# Patient Record
Sex: Male | Born: 1970 | Race: White | Hispanic: No | Marital: Married | Smoking: Current every day smoker
Health system: Southern US, Community
[De-identification: ages and names within clinical notes are randomized; demographics above are authoritative.]

## PROBLEM LIST (undated history)

## (undated) DIAGNOSIS — E785 Hyperlipidemia, unspecified: Secondary | ICD-10-CM

## (undated) DIAGNOSIS — I639 Cerebral infarction, unspecified: Secondary | ICD-10-CM

## (undated) DIAGNOSIS — I10 Essential (primary) hypertension: Secondary | ICD-10-CM

## (undated) DIAGNOSIS — E119 Type 2 diabetes mellitus without complications: Secondary | ICD-10-CM

## (undated) HISTORY — DX: Cerebral infarction, unspecified: I63.9

## (undated) HISTORY — DX: Hyperlipidemia, unspecified: E78.5

## (undated) HISTORY — PX: HAND SURGERY: SHX662

---

## 2005-02-11 ENCOUNTER — Emergency Department (HOSPITAL_COMMUNITY): Admission: EM | Admit: 2005-02-11 | Discharge: 2005-02-11 | Payer: Self-pay | Admitting: Emergency Medicine

## 2005-02-13 ENCOUNTER — Ambulatory Visit: Payer: Self-pay | Admitting: Internal Medicine

## 2005-02-26 ENCOUNTER — Encounter: Admission: RE | Admit: 2005-02-26 | Discharge: 2005-02-26 | Payer: Self-pay | Admitting: Neurology

## 2005-03-10 ENCOUNTER — Ambulatory Visit: Payer: Self-pay

## 2005-09-11 ENCOUNTER — Ambulatory Visit: Payer: Self-pay | Admitting: Internal Medicine

## 2005-09-18 ENCOUNTER — Ambulatory Visit: Payer: Self-pay | Admitting: Internal Medicine

## 2007-03-31 DIAGNOSIS — R51 Headache: Secondary | ICD-10-CM

## 2007-03-31 DIAGNOSIS — R519 Headache, unspecified: Secondary | ICD-10-CM | POA: Insufficient documentation

## 2020-01-30 ENCOUNTER — Emergency Department (HOSPITAL_COMMUNITY): Payer: Managed Care, Other (non HMO)

## 2020-01-30 ENCOUNTER — Inpatient Hospital Stay (HOSPITAL_COMMUNITY): Payer: Managed Care, Other (non HMO)

## 2020-01-30 ENCOUNTER — Other Ambulatory Visit: Payer: Self-pay

## 2020-01-30 ENCOUNTER — Inpatient Hospital Stay (HOSPITAL_COMMUNITY)
Admission: EM | Admit: 2020-01-30 | Discharge: 2020-02-06 | DRG: 065 | Disposition: A | Discharge status: Home Health | Payer: Managed Care, Other (non HMO) | Attending: Internal Medicine | Admitting: Internal Medicine

## 2020-01-30 ENCOUNTER — Encounter (HOSPITAL_COMMUNITY): Payer: Self-pay | Admitting: Emergency Medicine

## 2020-01-30 DIAGNOSIS — Z8249 Family history of ischemic heart disease and other diseases of the circulatory system: Secondary | ICD-10-CM | POA: Diagnosis not present

## 2020-01-30 DIAGNOSIS — Z20822 Contact with and (suspected) exposure to covid-19: Secondary | ICD-10-CM | POA: Diagnosis present

## 2020-01-30 DIAGNOSIS — Z6839 Body mass index (BMI) 39.0-39.9, adult: Secondary | ICD-10-CM | POA: Diagnosis not present

## 2020-01-30 DIAGNOSIS — E1159 Type 2 diabetes mellitus with other circulatory complications: Secondary | ICD-10-CM | POA: Diagnosis not present

## 2020-01-30 DIAGNOSIS — I63331 Cerebral infarction due to thrombosis of right posterior cerebral artery: Secondary | ICD-10-CM | POA: Diagnosis not present

## 2020-01-30 DIAGNOSIS — E785 Hyperlipidemia, unspecified: Secondary | ICD-10-CM | POA: Diagnosis present

## 2020-01-30 DIAGNOSIS — E669 Obesity, unspecified: Secondary | ICD-10-CM | POA: Diagnosis present

## 2020-01-30 DIAGNOSIS — I639 Cerebral infarction, unspecified: Secondary | ICD-10-CM | POA: Diagnosis not present

## 2020-01-30 DIAGNOSIS — F17210 Nicotine dependence, cigarettes, uncomplicated: Secondary | ICD-10-CM | POA: Diagnosis present

## 2020-01-30 DIAGNOSIS — I6389 Other cerebral infarction: Secondary | ICD-10-CM | POA: Diagnosis not present

## 2020-01-30 DIAGNOSIS — Z809 Family history of malignant neoplasm, unspecified: Secondary | ICD-10-CM | POA: Diagnosis not present

## 2020-01-30 DIAGNOSIS — G8191 Hemiplegia, unspecified affecting right dominant side: Secondary | ICD-10-CM | POA: Diagnosis present

## 2020-01-30 DIAGNOSIS — R29701 NIHSS score 1: Secondary | ICD-10-CM | POA: Diagnosis present

## 2020-01-30 DIAGNOSIS — E119 Type 2 diabetes mellitus without complications: Secondary | ICD-10-CM

## 2020-01-30 DIAGNOSIS — I456 Pre-excitation syndrome: Secondary | ICD-10-CM | POA: Diagnosis not present

## 2020-01-30 DIAGNOSIS — E1165 Type 2 diabetes mellitus with hyperglycemia: Secondary | ICD-10-CM | POA: Diagnosis present

## 2020-01-30 DIAGNOSIS — I6381 Other cerebral infarction due to occlusion or stenosis of small artery: Secondary | ICD-10-CM | POA: Diagnosis not present

## 2020-01-30 DIAGNOSIS — Z720 Tobacco use: Secondary | ICD-10-CM | POA: Diagnosis not present

## 2020-01-30 DIAGNOSIS — R531 Weakness: Secondary | ICD-10-CM | POA: Diagnosis not present

## 2020-01-30 DIAGNOSIS — I10 Essential (primary) hypertension: Secondary | ICD-10-CM | POA: Diagnosis not present

## 2020-01-30 DIAGNOSIS — R9389 Abnormal findings on diagnostic imaging of other specified body structures: Secondary | ICD-10-CM

## 2020-01-30 HISTORY — DX: Type 2 diabetes mellitus without complications: E11.9

## 2020-01-30 HISTORY — DX: Essential (primary) hypertension: I10

## 2020-01-30 LAB — GLUCOSE, CAPILLARY
Glucose-Capillary: 186 mg/dL — ABNORMAL HIGH (ref 70–99)
Glucose-Capillary: 257 mg/dL — ABNORMAL HIGH (ref 70–99)

## 2020-01-30 LAB — HIV ANTIBODY (ROUTINE TESTING W REFLEX): HIV Screen 4th Generation wRfx: NONREACTIVE

## 2020-01-30 LAB — URINALYSIS, ROUTINE W REFLEX MICROSCOPIC
Bacteria, UA: NONE SEEN
Bilirubin Urine: NEGATIVE
Glucose, UA: >=500 mg/dL — AB
Ketones, ur: NEGATIVE mg/dL
Leukocytes,Ua: NEGATIVE
Nitrite: NEGATIVE
Protein, ur: 100 mg/dL — AB
Specific Gravity, Urine: >1.046 — ABNORMAL HIGH (ref 1.005–1.030)
pH: 6 (ref 5.0–8.0)

## 2020-01-30 LAB — RAPID URINE DRUG SCREEN, HOSP PERFORMED
Amphetamines: NOT DETECTED
Barbiturates: NOT DETECTED
Benzodiazepines: NOT DETECTED
Cocaine: NOT DETECTED
Opiates: NOT DETECTED
Tetrahydrocannabinol: NOT DETECTED

## 2020-01-30 LAB — ECHOCARDIOGRAM COMPLETE
Area-P 1/2: 2.91 cm2
Height: 68 in
S' Lateral: 3 cm
Weight: 4160 oz

## 2020-01-30 LAB — CBC
HCT: 49.5 % (ref 39.0–52.0)
Hemoglobin: 17.5 g/dL — ABNORMAL HIGH (ref 13.0–17.0)
MCH: 31.3 pg (ref 26.0–34.0)
MCHC: 35.4 g/dL (ref 30.0–36.0)
MCV: 88.4 fL (ref 80.0–100.0)
Platelets: 234 10*3/uL (ref 150–400)
RBC: 5.6 MIL/uL (ref 4.22–5.81)
RDW: 11.5 % (ref 11.5–15.5)
WBC: 13.4 10*3/uL — ABNORMAL HIGH (ref 4.0–10.5)
nRBC: 0 % (ref 0.0–0.2)

## 2020-01-30 LAB — COMPREHENSIVE METABOLIC PANEL
ALT: 40 U/L (ref 0–44)
AST: 26 U/L (ref 15–41)
Albumin: 3.9 g/dL (ref 3.5–5.0)
Alkaline Phosphatase: 95 U/L (ref 38–126)
Anion gap: 11 (ref 5–15)
BUN: 11 mg/dL (ref 6–20)
CO2: 24 mmol/L (ref 22–32)
Calcium: 9.4 mg/dL (ref 8.9–10.3)
Chloride: 98 mmol/L (ref 98–111)
Creatinine, Ser: 0.82 mg/dL (ref 0.61–1.24)
GFR calc Af Amer: >60 mL/min (ref >60)
GFR calc non Af Amer: >60 mL/min (ref >60)
Glucose, Bld: 300 mg/dL — ABNORMAL HIGH (ref 70–99)
Potassium: 4 mmol/L (ref 3.5–5.1)
Sodium: 133 mmol/L — ABNORMAL LOW (ref 135–145)
Total Bilirubin: 1.1 mg/dL (ref 0.3–1.2)
Total Protein: 7.4 g/dL (ref 6.5–8.1)

## 2020-01-30 LAB — DIFFERENTIAL
Abs Immature Granulocytes: 0.04 10*3/uL (ref 0.00–0.07)
Basophils Absolute: 0.1 10*3/uL (ref 0.0–0.1)
Basophils Relative: 1 %
Eosinophils Absolute: 0.1 10*3/uL (ref 0.0–0.5)
Eosinophils Relative: 1 %
Immature Granulocytes: 0 %
Lymphocytes Relative: 14 %
Lymphs Abs: 1.8 10*3/uL (ref 0.7–4.0)
Monocytes Absolute: 1 10*3/uL (ref 0.1–1.0)
Monocytes Relative: 7 %
Neutro Abs: 10.4 10*3/uL — ABNORMAL HIGH (ref 1.7–7.7)
Neutrophils Relative %: 77 %

## 2020-01-30 LAB — I-STAT CHEM 8, ED
BUN: 12 mg/dL (ref 6–20)
Calcium, Ion: 1.1 mmol/L — ABNORMAL LOW (ref 1.15–1.40)
Chloride: 99 mmol/L (ref 98–111)
Creatinine, Ser: 0.6 mg/dL — ABNORMAL LOW (ref 0.61–1.24)
Glucose, Bld: 301 mg/dL — ABNORMAL HIGH (ref 70–99)
HCT: 50 % (ref 39.0–52.0)
Hemoglobin: 17 g/dL (ref 13.0–17.0)
Potassium: 3.9 mmol/L (ref 3.5–5.1)
Sodium: 135 mmol/L (ref 135–145)
TCO2: 24 mmol/L (ref 22–32)

## 2020-01-30 LAB — LIPID PANEL
Cholesterol: 295 mg/dL — ABNORMAL HIGH (ref 0–200)
HDL: 65 mg/dL (ref >40)
LDL Cholesterol: 197 mg/dL — ABNORMAL HIGH (ref 0–99)
Total CHOL/HDL Ratio: 4.5 RATIO
Triglycerides: 164 mg/dL — ABNORMAL HIGH (ref <150)
VLDL: 33 mg/dL (ref 0–40)

## 2020-01-30 LAB — PROTIME-INR
INR: 1 (ref 0.8–1.2)
Prothrombin Time: 12.8 seconds (ref 11.4–15.2)

## 2020-01-30 LAB — CBG MONITORING, ED
Glucose-Capillary: 241 mg/dL — ABNORMAL HIGH (ref 70–99)
Glucose-Capillary: 228 mg/dL — ABNORMAL HIGH (ref 70–99)

## 2020-01-30 LAB — ETHANOL: Alcohol, Ethyl (B): <10 mg/dL (ref <10)

## 2020-01-30 LAB — HEMOGLOBIN A1C
Hgb A1c MFr Bld: 11 % — ABNORMAL HIGH (ref 4.8–5.6)
Mean Plasma Glucose: 269 mg/dL

## 2020-01-30 LAB — SARS CORONAVIRUS 2 (TAT 6-24 HRS): SARS Coronavirus 2: NEGATIVE

## 2020-01-30 LAB — APTT: aPTT: 35 seconds (ref 24–36)

## 2020-01-30 MED ORDER — ENOXAPARIN SODIUM 40 MG/0.4ML ~~LOC~~ SOLN
40.0000 mg | SUBCUTANEOUS | Status: DC
  Administered 2020-01-30 – 2020-02-06 (×8): 40 mg via SUBCUTANEOUS
  Filled 2020-01-30 (×8): qty 0.4

## 2020-01-30 MED ORDER — ACETAMINOPHEN 325 MG PO TABS
650.0000 mg | ORAL_TABLET | ORAL | Status: DC | PRN
  Administered 2020-01-30 – 2020-02-01 (×2): 650 mg via ORAL
  Filled 2020-01-30 (×2): qty 2

## 2020-01-30 MED ORDER — ATORVASTATIN CALCIUM 80 MG PO TABS
80.0000 mg | ORAL_TABLET | Freq: Every day | ORAL | Status: DC
  Administered 2020-01-30 – 2020-02-06 (×8): 80 mg via ORAL
  Filled 2020-01-30 (×8): qty 1

## 2020-01-30 MED ORDER — ASPIRIN EC 81 MG PO TBEC
81.0000 mg | DELAYED_RELEASE_TABLET | Freq: Every day | ORAL | Status: DC
  Administered 2020-01-30 – 2020-02-06 (×8): 81 mg via ORAL
  Filled 2020-01-30 (×8): qty 1

## 2020-01-30 MED ORDER — INSULIN ASPART 100 UNIT/ML ~~LOC~~ SOLN
0.0000 [IU] | Freq: Three times a day (TID) | SUBCUTANEOUS | Status: DC
  Administered 2020-01-30 (×2): 5 [IU] via SUBCUTANEOUS
  Administered 2020-01-30: 3 [IU] via SUBCUTANEOUS
  Administered 2020-01-31: 2 [IU] via SUBCUTANEOUS
  Administered 2020-01-31: 5 [IU] via SUBCUTANEOUS
  Administered 2020-01-31 – 2020-02-01 (×2): 2 [IU] via SUBCUTANEOUS
  Administered 2020-02-01 (×2): 3 [IU] via SUBCUTANEOUS
  Administered 2020-02-02: 2 [IU] via SUBCUTANEOUS
  Administered 2020-02-02: 3 [IU] via SUBCUTANEOUS
  Administered 2020-02-02 – 2020-02-03 (×3): 2 [IU] via SUBCUTANEOUS
  Administered 2020-02-04: 3 [IU] via SUBCUTANEOUS
  Administered 2020-02-04: 2 [IU] via SUBCUTANEOUS
  Administered 2020-02-05: 3 [IU] via SUBCUTANEOUS
  Administered 2020-02-05 – 2020-02-06 (×3): 2 [IU] via SUBCUTANEOUS
  Administered 2020-02-06: 3 [IU] via SUBCUTANEOUS

## 2020-01-30 MED ORDER — LABETALOL HCL 5 MG/ML IV SOLN
10.0000 mg | Freq: Once | INTRAVENOUS | Status: AC
  Administered 2020-01-30: 10 mg via INTRAVENOUS
  Filled 2020-01-30: qty 4

## 2020-01-30 MED ORDER — STROKE: EARLY STAGES OF RECOVERY BOOK
Freq: Once | Status: AC
  Filled 2020-01-30: qty 1

## 2020-01-30 MED ORDER — IOHEXOL 350 MG/ML SOLN
100.0000 mL | Freq: Once | INTRAVENOUS | Status: AC | PRN
  Administered 2020-01-30: 100 mL via INTRAVENOUS

## 2020-01-30 MED ORDER — CLOPIDOGREL BISULFATE 75 MG PO TABS
75.0000 mg | ORAL_TABLET | Freq: Every day | ORAL | Status: DC
  Administered 2020-01-30 – 2020-02-06 (×8): 75 mg via ORAL
  Filled 2020-01-30 (×8): qty 1

## 2020-01-30 MED ORDER — INSULIN ASPART 100 UNIT/ML ~~LOC~~ SOLN
0.0000 [IU] | Freq: Every day | SUBCUTANEOUS | Status: DC
  Administered 2020-01-30: 3 [IU] via SUBCUTANEOUS
  Administered 2020-01-31: 2 [IU] via SUBCUTANEOUS

## 2020-01-30 MED ORDER — ACETAMINOPHEN 650 MG RE SUPP: 650.0000 mg | RECTAL | Status: DC | PRN

## 2020-01-30 MED ORDER — ASPIRIN 300 MG RE SUPP: 300.0000 mg | Freq: Every day | RECTAL | Status: DC

## 2020-01-30 MED ORDER — ASPIRIN 325 MG PO TABS: 325.0000 mg | ORAL_TABLET | Freq: Every day | ORAL | Status: DC

## 2020-01-30 MED ORDER — INSULIN GLARGINE 100 UNIT/ML ~~LOC~~ SOLN
15.0000 [IU] | Freq: Every day | SUBCUTANEOUS | Status: DC
  Administered 2020-01-30 – 2020-02-01 (×3): 15 [IU] via SUBCUTANEOUS
  Filled 2020-01-30 (×4): qty 0.15

## 2020-01-30 MED ORDER — ACETAMINOPHEN 160 MG/5ML PO SOLN: 650.0000 mg | ORAL | Status: DC | PRN

## 2020-01-30 MED ORDER — HYDRALAZINE HCL 20 MG/ML IJ SOLN
10.0000 mg | Freq: Four times a day (QID) | INTRAMUSCULAR | Status: DC | PRN
  Administered 2020-01-30: 10 mg via INTRAVENOUS
  Filled 2020-01-30: qty 1

## 2020-01-30 NOTE — ED Notes (Signed)
Echo at bedside

## 2020-01-30 NOTE — Progress Notes (Signed)
Received from ER via stretcher; patient is alert and oriented X4; oriented to room and unit routine; fall safety reviewed; bed low and locked with bed alarm set.

## 2020-01-30 NOTE — ED Notes (Signed)
TC from Dr Pola Corn  . MD will place orders for BP range and Prn for BP.

## 2020-01-30 NOTE — Consult Note (Signed)
Requesting Physician: Dr. Bebe Shaggy    Chief Complaint: Right side numbness, difficulty getting words out  History obtained from: Patient and Chart    HPI:                                                                                                                                       Peter Tran is a 49 y.o. male past medical history significant for hypertension, diabetes mellitus not on any medications presents department as a code stroke for right-sided numbness difficulty getting words out.  Last known normal was around 3 PM.  Patient off from work and then when he called his brother noticed his speech was off.  Patient also felt numbness over the right side of his arm and leg.  EMS was called and his blood pressure was noted to be 260 x 160 mmHg. Patient was transported as a code stroke for mild language difficulty getting some words out when speaking in sentences as well as numbness over his right face arm and leg.  NIHSS was 1 on arrival.  Stat CT head showed early ischemic changes in the left homonymous which would correspond to patient's symptoms.  Date last known well: 8.8.21 Time last known well: 3 PM tPA Given: No, outside TPA window mild symptoms NIHSS: 1 Baseline MRS 0   Past Medical History:  Diagnosis Date  . Diabetes mellitus without complication (HCC)   . Hypertension     History reviewed. No pertinent surgical history.  No family history on file. Social History:  reports that he has been smoking. He has never used smokeless tobacco. He reports current alcohol use. He reports that he does not use drugs.  Allergies: Not on File  Medications:                                                                                                                        I reviewed home medications   ROS:  14 systems reviewed and negative  except above    Examination:                                                                                                      General: Appears well-developed  Psych: Affect appropriate to situation Eyes: No scleral injection HENT: No OP obstrucion Head: Normocephalic.  Cardiovascular: Normal rate and regular rhythm.  Respiratory: Effort normal and breath sounds normal to anterior ascultation GI: Soft.  No distension. There is no tenderness.  Skin: WDI    Neurological Examination Mental Status: Alert, oriented, thought content appropriate.  Speech fluent without evidence of aphasia. Able to follow 3 step commands without difficulty. Cranial Nerves: II: Visual fields grossly normal,  III,IV, VI: ptosis not present, extra-ocular motions intact bilaterally, pupils equal, round, reactive to light and accommodation V,VII: smile symmetric, reduced sensation on the right side of face VIII: hearing normal bilaterally IX,X: uvula rises symmetrically XI: bilateral shoulder shrug XII: midline tongue extension Motor: Right : Upper extremity   5/5    Left:     Upper extremity   5/5  Lower extremity   5/5     Lower extremity   5/5 Tone and bulk:normal tone throughout; no atrophy noted Sensory: Reduced sensation of the right arm right leg Plantars: Right: downgoing   Left: downgoing Cerebellar: normal finger-to-nose, normal rapid alternating movements and normal heel-to-shin test      Lab Results: Basic Metabolic Panel: Recent Labs  Lab 01/30/20 0048  NA 135  K 3.9  CL 99  GLUCOSE 301*  BUN 12  CREATININE 0.60*    CBC: Recent Labs  Lab 01/30/20 0039 01/30/20 0048  WBC 13.4*  --   NEUTROABS 10.4*  --   HGB 17.5* 17.0  HCT 49.5 50.0  MCV 88.4  --   PLT 234  --     Coagulation Studies: No results for input(s): LABPROT, INR in the last 72 hours.  Imaging: CT HEAD CODE STROKE WO CONTRAST  Result Date: 01/30/2020 CLINICAL DATA:  Code stroke. 49 year old male with  right side numbness and difficulty speaking last seen normal 1500 hours. EXAM: CT HEAD WITHOUT CONTRAST TECHNIQUE: Contiguous axial images were obtained from the base of the skull through the vertex without intravenous contrast. COMPARISON:  Brain MRI 02/26/2005.  Head CT 02/11/2005. FINDINGS: Brain: Cerebral volume appears normal. No midline shift, ventriculomegaly, mass effect, evidence of mass lesion, intracranial hemorrhage or evidence of cortically based acute infarction. Subtle new hypodensity in the left lateral thalamus near the posterior limb of the left internal capsule. Otherwise gray-white matter differentiation is within normal limits throughout the brain. Vascular: Calcified atherosclerosis at the skull base. No suspicious intracranial vascular hyperdensity. Skull: Chronic appearing periapical lucency at the posterior right maxilla, likely dental related. No acute osseous abnormality identified. Sinuses/Orbits: Paranasal sinuses, tympanic cavities and mastoids are clear. Other: Slight rightward gaze. Otherwise negative orbit and scalp soft tissues. ASPECTS [facility] Stroke Program Early CT Score) Total score (0-10 with 10 being normal): 10 IMPRESSION: 1. Acute lacunar infarct suspected at the lateral left thalamus. No  associated hemorrhage or mass effect. 2. Elsewhere stable and negative noncontrast CT appearance of the brain. ASPECTS 10. 3. These results were communicated to Dr. Laurence Slate at 12:23 am on 01/30/2020 by text page via the [facility] messaging system. Electronically Signed   By: Odessa Fleming M.D.   On: 01/30/2020 00:24     ASSESSMENT AND PLAN  49 year old male with hypertension, diabetes who is not taking any medications presents with headache right-sided numbness likely secondary to hypertensive emergency and left thalamic stroke.  Acute ischemic stroke-left lateral thalamus Hypertensive crisis  Risk factors: Hypertension, diabetes, obesity Etiology: Likely small vessel  disease  Recommend #Admission to telemetry floor # MRI of the brain without contrast # CTA head and neck #Transthoracic Echo  # Start patient on ASA 81 mg and Plavix #Start or continue Atorvastatin 80 mg/other high intensity statin # BP goal: permissive HTN upto 220/120 mmHg, as needed labetalol # HBAIC and Lipid profile # Telemetry monitoring # Frequent neuro checks # NPO until passes stroke swallow screen  Please page stroke NP  Or  PA  Or MD from 8am -4 pm  as this patient from this time will be  followed by the stroke.   You can look them up on [email or web address]  Password [facility]   Sadiel Mota [facility] Pager Number [phone]

## 2020-01-30 NOTE — ED Provider Notes (Signed)
[facility] EMERGENCY DEPARTMENT Provider Note   CSN: [id number] Arrival date & time: 01/30/20  0010  An emergency department physician performed an initial assessment on this suspected stroke patient at 0012.  History Chief Complaint - weakness and numbness  Level 5 caveat due to acuity of condition  Peter Tran is a 49 y.o. male.  The history is provided by the patient and the EMS personnel. The history is limited by the condition of the patient.  Neurologic Problem This is a new problem. The current episode started 6 to 12 hours ago. The problem occurs constantly. The problem has not changed since onset.Pertinent negatives include no chest pain, no abdominal pain, no headaches and no shortness of breath. Nothing aggravates the symptoms. Nothing relieves the symptoms. He has tried nothing for the symptoms.   Patient presents as a code stroke.  Last known well at 1500 on August 8. Patient noted right-sided numbness in his face, arm, leg. Patient is not taking daily medications. No other details on arrival    Past Medical History:  Diagnosis Date  . Diabetes mellitus without complication (HCC)   . Hypertension     Patient Active Problem List   Diagnosis Date Noted  . HEADACHE 03/31/2007    History reviewed. No pertinent surgical history.     No family history on file.  Social History   Tobacco Use  . Smoking status: Current Every Day Smoker  . Smokeless tobacco: Never Used  Substance Use Topics  . Alcohol use: Yes    Comment: couple of beers/daily  . Drug use: Never    Home Medications Prior to Admission medications   Not on File    Allergies    Patient has no allergy information on record.  Review of Systems   Review of Systems  Unable to perform ROS: Acuity of condition  Respiratory: Negative for shortness of breath.   Cardiovascular: Negative for chest pain.  Gastrointestinal: Negative for abdominal pain.  Neurological: Negative  for syncope and headaches.    Physical Exam Updated Vital Signs BP (!) 192/106   Pulse 90   Temp (!) 97 F (36.1 C) (Temporal)   Resp 20   Ht 1.727 m (5\' 8" )   Wt 117.9 kg   SpO2 95%   BMI 39.53 kg/m   Physical Exam CONSTITUTIONAL: Well developed/well nourished HEAD: Normocephalic/atraumatic EYES: EOMI/PERRL ENMT: Mucous membranes moist NECK: supple no meningeal signs SPINE/BACK:entire spine nontender CV: S1/S2 noted, no murmurs/rubs/gallops noted LUNGS: Lungs are clear to auscultation bilaterally, no apparent distress ABDOMEN: soft, nontender, no rebound or guarding, bowel sounds noted throughout abdomen GU:no cva tenderness NEURO: Pt is awake/alert/appropriate, no facial droop.  No arm drift.?  Weakness in right leg.  He reports sensory deficit in right face, right arm, right leg. EXTREMITIES: pulses normal/equalx4, full ROM SKIN: warm, color normal PSYCH: no abnormalities of mood noted, alert and oriented to situation  ED Results / Procedures / Treatments   Labs (all labs ordered are listed, but only abnormal results are displayed) Labs Reviewed  CBC - Abnormal; Notable for the following components:      Result Value   WBC 13.4 (*)    Hemoglobin 17.5 (*)    All other components within normal limits  DIFFERENTIAL - Abnormal; Notable for the following components:   Neutro Abs 10.4 (*)    All other components within normal limits  COMPREHENSIVE METABOLIC PANEL - Abnormal; Notable for the following components:   Sodium 133 (*)  Glucose, Bld 300 (*)    All other components within normal limits  I-STAT CHEM 8, ED - Abnormal; Notable for the following components:   Creatinine, Ser 0.60 (*)    Glucose, Bld 301 (*)    Calcium, Ion 1.10 (*)    All other components within normal limits  SARS CORONAVIRUS 2 (TAT 6-24 HRS)  ETHANOL  PROTIME-INR  APTT  RAPID URINE DRUG SCREEN, HOSP PERFORMED  URINALYSIS, ROUTINE W REFLEX MICROSCOPIC    EKG EKG  Interpretation  Date/Time:  Monday January 30 2020 00:48:50 EDT Ventricular Rate:  98 PR Interval:    QRS Duration: 100 QT Interval:  372 QTC Calculation: 475 R Axis:   14 Text Interpretation: Sinus rhythm Vent pre-excitat'n(WPW), left acces'y pathway No old tracing to compare Confirmed by Zadie Rhine ([id number]) on 01/30/2020 1:06:06 AM   Radiology CT HEAD CODE STROKE WO CONTRAST  Result Date: 01/30/2020 CLINICAL DATA:  Code stroke. 49 year old male with right side numbness and difficulty speaking last seen normal 1500 hours. EXAM: CT HEAD WITHOUT CONTRAST TECHNIQUE: Contiguous axial images were obtained from the base of the skull through the vertex without intravenous contrast. COMPARISON:  Brain MRI 02/26/2005.  Head CT 02/11/2005. FINDINGS: Brain: Cerebral volume appears normal. No midline shift, ventriculomegaly, mass effect, evidence of mass lesion, intracranial hemorrhage or evidence of cortically based acute infarction. Subtle new hypodensity in the left lateral thalamus near the posterior limb of the left internal capsule. Otherwise gray-white matter differentiation is within normal limits throughout the brain. Vascular: Calcified atherosclerosis at the skull base. No suspicious intracranial vascular hyperdensity. Skull: Chronic appearing periapical lucency at the posterior right maxilla, likely dental related. No acute osseous abnormality identified. Sinuses/Orbits: Paranasal sinuses, tympanic cavities and mastoids are clear. Other: Slight rightward gaze. Otherwise negative orbit and scalp soft tissues. ASPECTS [facility] Stroke Program Early CT Score) Total score (0-10 with 10 being normal): 10 IMPRESSION: 1. Acute lacunar infarct suspected at the lateral left thalamus. No associated hemorrhage or mass effect. 2. Elsewhere stable and negative noncontrast CT appearance of the brain. ASPECTS 10. 3. These results were communicated to Dr. Laurence Slate at 12:23 am on 01/30/2020 by text page via the [facility]  messaging system. Electronically Signed   By: Odessa Fleming M.D.   On: 01/30/2020 00:24    Procedures .Critical Care Performed by: Zadie Rhine, MD Authorized by: Zadie Rhine, MD   Critical care provider statement:    Critical care time (minutes):  45   Critical care start time:  01/30/2020 12:15 AM   Critical care end time:  01/30/2020 1:00 AM   Critical care time was exclusive of:  Separately billable procedures and treating other patients   Critical care was necessary to treat or prevent imminent or life-threatening deterioration of the following conditions:  CNS failure or compromise   Critical care was time spent personally by me on the following activities:  Development of treatment plan with patient or surrogate, discussions with consultants, examination of patient, evaluation of patient's response to treatment, re-evaluation of patient's condition, ordering and review of radiographic studies, ordering and review of laboratory studies, ordering and performing treatments and interventions, pulse oximetry and obtaining history from patient or surrogate   I assumed direction of critical care for this patient from another provider in my specialty: no      Medications Ordered in ED Medications  labetalol (NORMODYNE) injection 10 mg (10 mg Intravenous Given 01/30/20 0043)  iohexol (OMNIPAQUE) 350 MG/ML injection 100 mL (100 mLs Intravenous Contrast Given  01/30/20 0156)    ED Course  I have reviewed the triage vital signs and the nursing notes.  Pertinent labs & imaging results that were available during my care of the patient were reviewed by me and considered in my medical decision making (see chart for details).    MDM Rules/Calculators/A&P                          1:15 AM Patient seen on arrival as a code stroke.  He arrives via EMS.  Patient reports last known well was at 3 PM on August 8.  Patient reports he noted right face, arm, leg numbness.  Patient has significant  hypertension with blood pressures over 200.  Patient was awake and alert, denies any pain complaints.  Patient was seen in conjunction with neurology Dr. Laurence Slate.  CT head reveals lacunar infarct.  Patient is outside the window for any acute intervention after discussion with neurology. tPA in stroke considered but not given due to: Onset over 3-4.5hours  Due to severe hypertension, patient was given IV dose of labetalol.  His blood pressure is now improved to the 190s systolic.  On review of EKG, it is revealed the patient has what appears to be delta waves and potential Wolff-Parkinson-White syndrome. Patient denies any recent palpitations or syncope.  He has not seen a physician in over 4 years but does not recall this diagnosis previously.  This can be evaluated by cardiology on a nonemergent basis 2:06 AM Discussed the case with Dr. Julian Reil with [facility].  Patient will be admitted for stroke evaluation Final Clinical Impression(s) / ED Diagnoses Final diagnoses:  Lacunar infarct, acute (HCC)  Phillips Odor White pattern seen on electrocardiogram    Rx / DC Orders ED Discharge Orders    None       Zadie Rhine, MD 01/30/20 0206

## 2020-01-30 NOTE — ED Notes (Signed)
E message DR Pola Corn  About BP and Prn meds for BP

## 2020-01-30 NOTE — ED Notes (Signed)
Requested SEC to call Attending MD

## 2020-01-30 NOTE — Progress Notes (Addendum)
STROKE TEAM PROGRESS NOTE   INTERVAL HISTORY Patient is awake, alert, NAD, no family at bedside.  Patient does not have a primary care provider, and was not on any medication prior to admission. Discussed risk factors with patient and current need for medications as well as risk factor modifications. Patient verbalized an understanding.   Vitals:   01/30/20 0404 01/30/20 0550 01/30/20 0630 01/30/20 0730  BP: (!) 168/93 (!) 192/101 (!) 199/102 (!) 180/107  Pulse: 83 84 (!) 106 81  Resp: 20 18 20 15   Temp:      TempSrc:      SpO2: 96% 97% 100% 96%  Weight:      Height:       CBC:  Recent Labs  Lab 01/30/20 0039 01/30/20 0048  WBC 13.4*  --   NEUTROABS 10.4*  --   HGB 17.5* 17.0  HCT 49.5 50.0  MCV 88.4  --   PLT 234  --    Basic Metabolic Panel:  Recent Labs  Lab 01/30/20 0039 01/30/20 0048  NA 133* 135  K 4.0 3.9  CL 98 99  CO2 24  --   GLUCOSE 300* 301*  BUN 11 12  CREATININE 0.82 0.60*  CALCIUM 9.4  --    Lipid Panel:  Recent Labs  Lab 01/30/20 0559  CHOL 295*  TRIG 164*  HDL 65  CHOLHDL 4.5  VLDL 33  LDLCALC 03/31/20*   HgbA1c:  Recent Labs  Lab 01/30/20 0559  HGBA1C 11.0*    Alcohol Level  Recent Labs  Lab 01/30/20 0039  ETH <10    IMAGING past 24 hours CT ANGIO HEAD W OR WO CONTRAST  Result Date: 01/30/2020 CLINICAL DATA:  Neuro deficit, acute, stroke suspected EXAM: CT ANGIOGRAPHY HEAD AND NECK TECHNIQUE: Multidetector CT imaging of the head and neck was performed using the standard protocol during bolus administration of intravenous contrast. Multiplanar CT image reconstructions and MIPs were obtained to evaluate the vascular anatomy. Carotid stenosis measurements (when applicable) are obtained utilizing NASCET criteria, using the distal internal carotid diameter as the denominator. CONTRAST:  03/31/2020 OMNIPAQUE IOHEXOL 350 MG/ML SOLN COMPARISON:  None. FINDINGS: CTA NECK FINDINGS SKELETON: There is no bony spinal canal stenosis. No lytic or blastic  lesion. OTHER NECK: Normal pharynx, larynx and major salivary glands. No cervical lymphadenopathy. Unremarkable thyroid gland. UPPER CHEST: No pneumothorax or pleural effusion. No nodules or masses. AORTIC ARCH: There is no calcific atherosclerosis of the aortic arch. There is no aneurysm, dissection or hemodynamically significant stenosis of the visualized portion of the aorta. Conventional 3 vessel aortic branching pattern. The visualized proximal subclavian arteries are widely patent. RIGHT CAROTID SYSTEM: Normal without aneurysm, dissection or stenosis. LEFT CAROTID SYSTEM: Normal without aneurysm, dissection or stenosis. VERTEBRAL ARTERIES: Right dominant configuration. Both origins are clearly patent. There is no dissection, occlusion or flow-limiting stenosis to the skull base (V1-V3 segments). CTA HEAD FINDINGS POSTERIOR CIRCULATION: --Vertebral arteries: Atherosclerotic calcification narrows the left V4 segment. The right is normal. --Inferior cerebellar arteries: Normal. --Basilar artery: Normal. --Superior cerebellar arteries: Normal. --Posterior cerebral arteries (PCA): Normal. ANTERIOR CIRCULATION: --Intracranial internal carotid arteries: There is moderate stenosis of the lacerum segment of the left internal carotid artery secondary to noncalcified plaque. Otherwise, there is bilateral atherosclerotic calcification with mild multifocal stenosis. --Anterior cerebral arteries (ACA): Normal. Both A1 segments are present. Patent anterior communicating artery (a-comm). --Middle cerebral arteries (MCA): Normal. VENOUS SINUSES: As permitted by contrast timing, patent. ANATOMIC VARIANTS: None Review of the MIP images confirms  the above findings. IMPRESSION: 1. No emergent large vessel occlusion. 2. Moderate stenosis of the lacerum segment of the left internal carotid artery secondary to noncalcified plaque. 3. Mild stenosis of the left vertebral artery V4 segment. 4. Aortic Atherosclerosis (ICD10-I70.0).  Electronically Signed   By: Deatra RobinsonKevin  Herman M.D.   On: 01/30/2020 02:13   DG Chest 2 View  Result Date: 01/30/2020 CLINICAL DATA:  Right-sided numbness EXAM: CHEST - 2 VIEW COMPARISON:  None. FINDINGS: The heart size and mediastinal contours are within normal limits. Both lungs are clear. The visualized skeletal structures are unremarkable. IMPRESSION: No active cardiopulmonary disease. Electronically Signed   By: Deatra RobinsonKevin  Herman M.D.   On: 01/30/2020 02:36   CT ANGIO NECK W OR WO CONTRAST  Result Date: 01/30/2020 CLINICAL DATA:  Neuro deficit, acute, stroke suspected EXAM: CT ANGIOGRAPHY HEAD AND NECK TECHNIQUE: Multidetector CT imaging of the head and neck was performed using the standard protocol during bolus administration of intravenous contrast. Multiplanar CT image reconstructions and MIPs were obtained to evaluate the vascular anatomy. Carotid stenosis measurements (when applicable) are obtained utilizing NASCET criteria, using the distal internal carotid diameter as the denominator. CONTRAST:  100mL OMNIPAQUE IOHEXOL 350 MG/ML SOLN COMPARISON:  None. FINDINGS: CTA NECK FINDINGS SKELETON: There is no bony spinal canal stenosis. No lytic or blastic lesion. OTHER NECK: Normal pharynx, larynx and major salivary glands. No cervical lymphadenopathy. Unremarkable thyroid gland. UPPER CHEST: No pneumothorax or pleural effusion. No nodules or masses. AORTIC ARCH: There is no calcific atherosclerosis of the aortic arch. There is no aneurysm, dissection or hemodynamically significant stenosis of the visualized portion of the aorta. Conventional 3 vessel aortic branching pattern. The visualized proximal subclavian arteries are widely patent. RIGHT CAROTID SYSTEM: Normal without aneurysm, dissection or stenosis. LEFT CAROTID SYSTEM: Normal without aneurysm, dissection or stenosis. VERTEBRAL ARTERIES: Right dominant configuration. Both origins are clearly patent. There is no dissection, occlusion or flow-limiting  stenosis to the skull base (V1-V3 segments). CTA HEAD FINDINGS POSTERIOR CIRCULATION: --Vertebral arteries: Atherosclerotic calcification narrows the left V4 segment. The right is normal. --Inferior cerebellar arteries: Normal. --Basilar artery: Normal. --Superior cerebellar arteries: Normal. --Posterior cerebral arteries (PCA): Normal. ANTERIOR CIRCULATION: --Intracranial internal carotid arteries: There is moderate stenosis of the lacerum segment of the left internal carotid artery secondary to noncalcified plaque. Otherwise, there is bilateral atherosclerotic calcification with mild multifocal stenosis. --Anterior cerebral arteries (ACA): Normal. Both A1 segments are present. Patent anterior communicating artery (a-comm). --Middle cerebral arteries (MCA): Normal. VENOUS SINUSES: As permitted by contrast timing, patent. ANATOMIC VARIANTS: None Review of the MIP images confirms the above findings. IMPRESSION: 1. No emergent large vessel occlusion. 2. Moderate stenosis of the lacerum segment of the left internal carotid artery secondary to noncalcified plaque. 3. Mild stenosis of the left vertebral artery V4 segment. 4. Aortic Atherosclerosis (ICD10-I70.0). Electronically Signed   By: Deatra RobinsonKevin  Herman M.D.   On: 01/30/2020 02:13   MR BRAIN WO CONTRAST  Result Date: 01/30/2020 CLINICAL DATA:  Stroke follow-up EXAM: MRI HEAD WITHOUT CONTRAST TECHNIQUE: Multiplanar, multiecho pulse sequences of the brain and surrounding structures were obtained without intravenous contrast. COMPARISON:  Brain MRI 02/26/2005 Head CT 01/30/2020 FINDINGS: Brain: Small acute infarct of the anterolateral left thalamus. Normal white matter signal. Normal volume of CSF spaces. No chronic microhemorrhage. Normal midline structures. Vascular: Normal flow voids. Skull and upper cervical spine: Normal marrow signal. Sinuses/Orbits: Negative. Other: None. IMPRESSION: Small acute infarct of the anterolateral left thalamus. No acute hemorrhage or  mass effect. Electronically Signed  By: Deatra Robinson M.D.   On: 01/30/2020 01:51   CT HEAD CODE STROKE WO CONTRAST  Result Date: 01/30/2020 CLINICAL DATA:  Code stroke. 49 year old male with right side numbness and difficulty speaking last seen normal 1500 hours. EXAM: CT HEAD WITHOUT CONTRAST TECHNIQUE: Contiguous axial images were obtained from the base of the skull through the vertex without intravenous contrast. COMPARISON:  Brain MRI 02/26/2005.  Head CT 02/11/2005. FINDINGS: Brain: Cerebral volume appears normal. No midline shift, ventriculomegaly, mass effect, evidence of mass lesion, intracranial hemorrhage or evidence of cortically based acute infarction. Subtle new hypodensity in the left lateral thalamus near the posterior limb of the left internal capsule. Otherwise gray-white matter differentiation is within normal limits throughout the brain. Vascular: Calcified atherosclerosis at the skull base. No suspicious intracranial vascular hyperdensity. Skull: Chronic appearing periapical lucency at the posterior right maxilla, likely dental related. No acute osseous abnormality identified. Sinuses/Orbits: Paranasal sinuses, tympanic cavities and mastoids are clear. Other: Slight rightward gaze. Otherwise negative orbit and scalp soft tissues. ASPECTS [facility] Stroke Program Early CT Score) Total score (0-10 with 10 being normal): 10 IMPRESSION: 1. Acute lacunar infarct suspected at the lateral left thalamus. No associated hemorrhage or mass effect. 2. Elsewhere stable and negative noncontrast CT appearance of the brain. ASPECTS 10. 3. These results were communicated to Dr. Laurence Slate at 12:23 am on 01/30/2020 by text page via the [facility] messaging system. Electronically Signed   By: Odessa Fleming M.D.   On: 01/30/2020 00:24    PHYSICAL EXAM  ZY:SAYTK, alert, able to follow commands, oriented to name/age/month/year CN: VFF,PERRLA, EOEMI, tongue protrudes midline. Face symmetric, Decreases facial sensation/tingling  right side DTRs: 2+ biceps, patella Cerebellar: no ataxia noted. Motor: 5/5 strength in all 4 extremties Sensory: decreased sensation arm and leg right side.  ASSESSMENT/PLAN Peter Tran is a 49 y.o. male past medical history significant for hypertension, diabetes mellitus not on any medications presents department as a code stroke for right-sided numbness difficulty getting words out..   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  Code Stroke CTH 1. Acute lacunar infarct suspected at the lateral left thalamus. No associated hemorrhage or mass effect. 2. Elsewhere stable and negative noncontrast CT appearance of thebrain. ASPECTS 10  CTA head & neck 1. No emergent large vessel occlusion. 2. Moderate stenosis of the lacerum segment of the left internal carotid artery secondary to noncalcified plaque. 3. Mild stenosis of the left vertebral artery V4 segment. MRI  Small acute infarct of the anterolateral left thalamus. No acute hemorrhage or mass effect.  2D Echo 1. Left ventricular ejection fraction, by estimation, is 55 to 60%. The  left ventricle has normal function. The left ventricle has no regional  wall motion abnormalities. Left ventricular diastolic function could not  be evaluated.  2. Right ventricular systolic function is normal. The right ventricular  size is normal. Tricuspid regurgitation signal is inadequate for assessing  PA pressure.  3. The mitral valve is degenerative. No evidence of mitral valve  regurgitation. No evidence of mitral stenosis.  4. The aortic valve is tricuspid. Aortic valve regurgitation is not  visualized. No aortic stenosis is present.  5. The inferior vena cava is normal in size with greater than 50%  respiratory variability, suggesting right atrial pressure of 3 mmHg.   Conclusion(s)/Recommendation(s): No intracardiac source of embolism  detected on this transthoracic study. A transesophageal echocardiogram  is  recommended to exclude cardiac source of embolism if clinically indicated.  LDL 197  HgbA1c 11.0  VTE prophylaxis - lovenox    Diet   Diet Carb Modified Fluid consistency: Thin; Room service appropriate? Yes     No antithrombotic prior to admission, now on aspirin 81 mg daily.   Therapy recommendations:  pending  Disposition:  Pending   Hypertension  Home meds:  None   Unstable . Permissive hypertension (OK if < 220/120) but gradually normalize in 5-7 days . Long-term BP goal normotensive  Hyperlipidemia  Home meds:  none,  LDL 197, goal < 70  Add atorvastatin 80 mg daily  Continue statin at discharge  Diabetes type II Uncontrolled  Home meds:  none  HgbA1c 11.0, goal < 7.0  CBGs Recent Labs    01/30/20 0810  GLUCAP 241*      SSI  Other Stroke Risk Factors  Cigarette smoker advised to stop smoking  ETOH use, alcohol level <10, advised to drink no more than 2 drink(s) a day  Obesity, Body mass index is 39.53 kg/m., recommend weight loss, diet and exercise as appropriate   Other recommendations  Smoking cessation   needs Primary care provider Hospital day # 0  Valentina Lucks, MSN, NP-C [facility] [phone]  ATTENDING NOTE: I reviewed above note and agree with the assessment and plan. Pt was seen and examined.   49 year old male with history of hypertension, diabetes, smoker admitted for right-sided numbness, ataxia, speech difficulty.  BP 260/160 on presentation.  MRI showed left thalamic infarct.  CT head and neck multifocal atherosclerosis including left VA origin, bilateral ICA siphon, left ICA bulb and mild stenosis of basilar artery.  EF 55 to 60%.  A1c 11.0, LDL 197.  UDS negative.  BP 160s to 190s during admission.  On exam, patient awake alert, orientated x3.  Visual field full, no gaze deviation, PERRL, facial symmetrical, tongue midline.  Bilateral upper and lower extremity 5/5 strength, however right face  arm and leg decreased light touch sensation with right finger-to-nose and heel-to-shin ataxia.  Patient stroke etiology likely due to small vessel disease, risk factors including uncontrolled diabetes, hyperlipidemia, and hypertension as well as smoking.  Recommend aspirin 81 and Plavix 75 DAPT for 3 weeks and then aspirin alone.  Continue Lipitor 80.  Aggressive risk factor modification.  Quit smoking.  Close follow-up with PCP.  PT/OT recommend home health PT/OT.  Neurology will sign off. Please call with questions. Pt will follow up with stroke clinic NP at [facility] in about 4 weeks. Thanks for the consult.   Marvel Plan, MD PhD Stroke Neurology 01/30/2020 8:57 PM     To contact Stroke Continuity provider, please refer to [email or web address]. After hours, contact General Neurology

## 2020-01-30 NOTE — ED Notes (Signed)
TC to [place] on [address] to report Pt had PRN for BP and SBP is now 171/100

## 2020-01-30 NOTE — ED Notes (Signed)
Pt to MRI at this time.

## 2020-01-30 NOTE — Evaluation (Signed)
Physical Therapy Evaluation Patient Details Name: Peter Tran MRN: [id number] DOB: Jun 22, 1971 Today's Date: 01/30/2020   History of Present Illness  Pt is a 49yo male admitted with L thalamus CVA. Pt hasn't been to a MD in a while. Noted high BP 260/160. PMH: DM, HTN    Clinical Impression  Pt admitted with above. Pt was indep and working PTA. Pt now with impaired sensation, co-ordination and strength on R UE and LE limiting ability to transfer and ambulate safely indep. Was limited to mobility in room today with further assess ambulation next session. Pt will need be functioning safely at a mod I level for safe d/c home as pt lives alone.    Follow Up Recommendations Home health PT;Supervision/Assistance - 24 hour (will re-asses next session)    Equipment Recommendations  Rolling walker with 5" wheels (may progress and not need it)    Recommendations for Other Services       Precautions / Restrictions Precautions Precautions: Fall Precaution Comments: R sided numbness Restrictions Weight Bearing Restrictions: No      Mobility  Bed Mobility Overal bed mobility: Needs Assistance Bed Mobility: Supine to Sit;Sit to Supine     Supine to sit: Min guard Sit to supine: Min guard   General bed mobility comments: min guard for safety due to impulsivity and narrow gurney in ED  Transfers Overall transfer level: Needs assistance Equipment used: None Transfers: Sit to/from Stand Sit to Stand: Min assist         General transfer comment: minA for safety, pt reaching for something to hold onto, pt feels unsteady  Ambulation/Gait Ambulation/Gait assistance: Min assist;Mod assist Gait Distance (Feet): 5 Feet Assistive device: 1 person hand held assist Gait Pattern/deviations: Decreased stride length;Wide base of support Gait velocity: slow   General Gait Details: pt reports "I feel like I'm drunk". limited to ED room per RN due to business in hallway. pt unsteady and would  benefit from RW at this time  Stairs            Wheelchair Mobility    Modified Rankin (Stroke Patients Only) Modified Rankin (Stroke Patients Only) Pre-Morbid Rankin Score: No symptoms Modified Rankin: Moderate disability     Balance Overall balance assessment: Needs assistance Sitting-balance support: Feet supported;No upper extremity supported Sitting balance-Leahy Scale: Fair     Standing balance support: Single extremity supported Standing balance-Leahy Scale: Poor Standing balance comment: pt dependent on device or physical assist                             Pertinent Vitals/Pain Pain Assessment: 0-10 Pain Score: 0-No pain Pain Location: headache- recevied 2 tyelnol and now no headache    Home Living Family/patient expects to be discharged to:: Private residence Living Arrangements: Alone Available Help at Discharge: Family;Available PRN/intermittently (pt reports he has some neighbors that could help) Type of Home: House Home Access: Level entry     Home Layout: One level Home Equipment: None      Prior Function Level of Independence: Independent         Comments: works as a Production designer, theatre/television/film at [employer]   Dominant Hand: Right    Extremity/Trunk Assessment   Upper Extremity Assessment Upper Extremity Assessment: RUE deficits/detail RUE Deficits / Details: grossly 4/5 RUE Sensation: decreased light touch (50% deficits) RUE Coordination: decreased fine motor;decreased gross motor    Lower Extremity Assessment Lower Extremity  Assessment: RLE deficits/detail RLE Deficits / Details: groslsy 4/5 RLE Sensation: decreased light touch (deficit of 50%) RLE Coordination: decreased gross motor    Cervical / Trunk Assessment Cervical / Trunk Assessment: Normal  Communication   Communication: Expressive difficulties  Cognition Arousal/Alertness: Awake/alert Behavior During Therapy: Impulsive Overall Cognitive Status:  No family/caregiver present to determine baseline cognitive functioning Area of Impairment: Safety/judgement;Problem solving                         Safety/Judgement: Decreased awareness of safety   Problem Solving: Requires verbal cues;Requires tactile cues General Comments: pt aware of deficits however remains impulsive      General Comments General comments (skin integrity, edema, etc.): BP 189/102, SOB with movement, HR 120s, RR in 30s, pt denies SOB when asked    Exercises     Assessment/Plan    PT Assessment Patient needs continued PT services  PT Problem List Decreased strength;Decreased activity tolerance;Decreased balance;Decreased mobility;Decreased coordination;Decreased cognition;Decreased knowledge of use of DME;Decreased safety awareness;Impaired sensation       PT Treatment Interventions DME instruction;Gait training;Stair training;Functional mobility training;Therapeutic activities;Therapeutic exercise;Balance training;Neuromuscular re-education;Cognitive remediation    PT Goals (Current goals can be found in the Care Plan section)  Acute Rehab PT Goals Patient Stated Goal: get better, not to rush it PT Goal Formulation: With patient Time For Goal Achievement: 02/13/20 Potential to Achieve Goals: Good Additional Goals Additional Goal #1: Pt to scrore >19 on DGI to indicate minimal falls risk.    Frequency Min 4X/week   Barriers to discharge Decreased caregiver support lives alone    Co-evaluation               AM-PAC PT "6 Clicks" Mobility  Outcome Measure Help needed turning from your back to your side while in a flat bed without using bedrails?: A Little Help needed moving from lying on your back to sitting on the side of a flat bed without using bedrails?: A Little Help needed moving to and from a bed to a chair (including a wheelchair)?: A Little Help needed standing up from a chair using your arms (e.g., wheelchair or bedside  chair)?: A Little Help needed to walk in hospital room?: A Lot Help needed climbing 3-5 steps with a railing? : A Lot 6 Click Score: 16    End of Session Equipment Utilized During Treatment: Gait belt Activity Tolerance: Patient limited by fatigue (and being in ED) Patient left: in bed;with call bell/phone within reach Nurse Communication: Mobility status PT Visit Diagnosis: Unsteadiness on feet (R26.81);Difficulty in walking, not elsewhere classified (R26.2)    Time: 1315-1330 PT Time Calculation (min) (ACUTE ONLY): 15 min   Charges:   PT Evaluation $PT Eval Moderate Complexity: 1 Mod          Lewis Shock, PT, DPT Acute Rehabilitation Services Pager #: [phone] Office #: [phone]   Iona Hansen 01/30/2020, 2:35 PM

## 2020-01-30 NOTE — Progress Notes (Signed)
PROGRESS NOTE  Peter Tran  DOB: 06-29-1970  PCP: Patient, No Pcp Per INO:[id number]  DOA: 01/30/2020  LOS: 0 days   No chief complaint on file.  Brief narrative: Peter Tran is a 49 y.o. male with PMH of colitis and hypertension who had not seen a physician for last 4 years and was not taking any medicine at home. Patient presented to the ED on 01/30/2020 with complaint of headache, right-sided numbness and word finding difficulties. EMS noted blood pressure to be 260/160. Brought to ED as a stroke alert. Stat CT head showed acute lacunar infarct at the lateral left thalamus. MRI confirmed CT finding. CT angio of head and neck did not show any emergent large vessel occlusion.  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 Seen by neurology. Admitted to hospital service for stroke work-up.  Subjective: Patient was seen and examined this morning. Waiting in ED for inpatient bed Anxious.  Continues to have word finding difficulty.  Continues to report right-sided numbness. Blood pressure systolic within 180 and 200 mostly. LDL 197, HDL 65, A1c 11  Assessment/Plan: Acute ischemic stroke-left lateral thalamus -Presented with right-sided numbness, headache and word finding difficulty. -CT and MRI finding as above showing acute infarct of lateral left thrombus -Stroke work-up started. -LDL 197, HDL 65, A1c 11 -Blood pressures running elevated.  Permissive hypertension allowed for first 48 to 72 hours. -Pending echocardiogram. -Neurology consultation appreciated. -PT/OT/ST eval awaited. -Started on dual antiplatelet therapy with aspirin 81 mg daily and Plavix 75 mg daily and Lipitor 80 mg daily.  Uncontrolled diabetes mellitus type 2 -A1c 11 -Not on any meds at home. -Sliding-scale insulin with Accu-Cheks. -I will start patient on Lantus 15 units this morning.  Hypertensive urgency -Not on blood pressure at home.  Presented with a blood pressure of  209/129 -Permissive hypertension allowed for now.  Intend to start on blood pressure medicines prior to discharge.  Dyslipidemia -LDL 197, HDL 65 -Started on Lipitor 80 mg daily.  ?  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 -No h/o syncope nor random palpitations per patient. -Admitting MD has consulted cardiology.  Mobility: PT eval pending Code Status:   Code Status: Full Code  Nutritional status: Body mass index is 39.53 kg/m.     Diet Order            Diet Carb Modified Fluid consistency: Thin; Room service appropriate? Yes  Diet effective now                 DVT prophylaxis: enoxaparin (LOVENOX) injection 40 mg Start: 01/30/20 1000   Antimicrobials:  None Fluid: None  Consultants: Neurology, cardiology Family Communication:  None at bedside  Status is: Inpatient  Remains inpatient appropriate because:Ongoing diagnostic testing needed not appropriate for outpatient work up   Dispo: The patient is from: Home              Anticipated d/c is to: Home hopefully              Anticipated d/c date is: 2 days              Patient currently is not medically stable to d/c.       Infusions:    Scheduled Meds: .  stroke: mapping our early stages of recovery book   Does not apply Once  . aspirin EC  81 mg Oral Daily  . atorvastatin  80 mg Oral Daily  . clopidogrel  75  mg Oral Daily  . enoxaparin (LOVENOX) injection  40 mg Subcutaneous Q24H  . insulin aspart  0-15 Units Subcutaneous TID WC  . insulin aspart  0-5 Units Subcutaneous QHS    Antimicrobials: Anti-infectives (From admission, onward)   None      PRN meds: acetaminophen **OR** acetaminophen (TYLENOL) oral liquid 160 mg/5 mL **OR** acetaminophen   Objective: Vitals:   01/30/20 0730 01/30/20 0828  BP: (!) 180/107 (!) 182/93  Pulse: 81 87  Resp: 15 18  Temp:    SpO2: 96% 95%   No intake or output data in the 24 hours ending 01/30/20 0911 Filed Weights   01/30/20 0033  Weight:  117.9 kg   Weight change:  Body mass index is 39.53 kg/m.   Physical Exam: General exam: Appears calm and comfortable.  Not in physical distress Skin: No rashes, lesions or ulcers. HEENT: Atraumatic, normocephalic, supple neck, no obvious bleeding Lungs: Clear to auscultation bilaterally CVS: Regular rate and rhythm, no murmur GI/Abd soft, nontender, nondistended, bowel sound present CNS: Alert, awake, oriented x3, did not appreciate any weakness.  Reports numbness Psychiatry: Anxious, continues to have word finding difficulty Extremities: No pedal edema, no calf tenderness  Data Review: I have personally reviewed the laboratory data and studies available.  Recent Labs  Lab 01/30/20 0039 01/30/20 0048  WBC 13.4*  --   NEUTROABS 10.4*  --   HGB 17.5* 17.0  HCT 49.5 50.0  MCV 88.4  --   PLT 234  --    Recent Labs  Lab 01/30/20 0039 01/30/20 0048  NA 133* 135  K 4.0 3.9  CL 98 99  CO2 24  --   GLUCOSE 300* 301*  BUN 11 12  CREATININE 0.82 0.60*  CALCIUM 9.4  --    Lab Results  Component Value Date   HGBA1C 11.0 (H) 01/30/2020       Component Value Date/Time   CHOL 295 (H) 01/30/2020 0559   TRIG 164 (H) 01/30/2020 0559   HDL 65 01/30/2020 0559   CHOLHDL 4.5 01/30/2020 0559   VLDL 33 01/30/2020 0559   LDLCALC 197 (H) 01/30/2020 0559    Signed, Lorin Glass, MD [facility] Pager: [phone] (Secure Chat preferred). 01/30/2020

## 2020-01-30 NOTE — Consult Note (Addendum)
Cardiology Consultation:   Patient ID: CALYB MCQUARRIE; [id number]; 11/17/1970   Admit date: 01/30/2020 Date of Consult: 01/30/2020  Primary Care Provider: Patient, No Pcp Per Primary Cardiologist: Rollene Rotunda, MD New Primary Electrophysiologist:  None   Patient Profile:   Peter Tran is a 49 y.o. male with a hx of DM, HTN, tob use, no recent medical care, who is being seen today for the evaluation of WPW on ECG  at the request of Dr Pola Corn.  History of Present Illness:   Mr. Stankus was admitted early this am w/ CVA L thalamus. CBG was 300, BP 260/160 by EMS.  WPW pattern seen on ECG>>Cards asked to see.   Mr. Bakos has never had any cardiac symptoms.  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  He walks a lot at work and the job is physically strenuous at times.  He has never had chest pain or shortness of breath.  He has never had palpitations.  He has never felt his heart skip or race.  He has no history of syncope or presyncope.  He does not check his blood pressure and does not know what it has been running.  He works nights at [employer], and worked Saturday night.  He developed symptoms at approximately 3:00 Sunday afternoon.  His stroke symptoms have improved a little since being admitted.  However, he still does have some speech difficulty and right upper extremity coordination problems   Past Medical History:  Diagnosis Date  . Diabetes mellitus without complication (HCC)   . Hypertension     Past Surgical History:  Procedure Laterality Date  . HAND SURGERY Right      Prior to Admission medications   No prescription medications    Inpatient Medications: Scheduled Meds: .  stroke: mapping our early stages of recovery book   Does not apply Once  . aspirin EC  81 mg Oral Daily  . atorvastatin  80 mg Oral Daily  . clopidogrel  75 mg Oral Daily  . enoxaparin (LOVENOX) injection  40 mg Subcutaneous Q24H  . insulin aspart  0-15 Units  Subcutaneous TID WC  . insulin aspart  0-5 Units Subcutaneous QHS  . insulin glargine  15 Units Subcutaneous Daily   Continuous Infusions:  PRN Meds: acetaminophen **OR** acetaminophen (TYLENOL) oral liquid 160 mg/5 mL **OR** acetaminophen  Allergies:   No Known Allergies  Social History:   Social History   Socioeconomic History  . Marital status: Married    Spouse name: Not on file  . Number of children: Not on file  . Years of education: Not on file  . Highest education level: Not on file  Occupational History  . Occupation: Associate Professor, Airline pilot: Designer, jewell[employer]  Tobacco Use  . Smoking status: Current Every Day Smoker  . Smokeless tobacco: Never Used  Substance and Sexual Activity  . Alcohol use: Yes    Comment: couple of beers/daily  . Drug use: Never  . Sexual activity: Not on file  Other Topics Concern  . Not on file  Social History Narrative  . Not on file   Social Determinants of Health   Financial Resource Strain:   . Difficulty of Paying Living Expenses:   Food Insecurity:   . Worried About Programme researcher, broadcasting/film/video in the Last Year:   . Barista in the Last Year:   Transportation Needs:   . Freight forwarder (Medical):   Marland Kitchen  Lack of Transportation (Non-Medical):   Physical Activity:   . Days of Exercise per Week:   . Minutes of Exercise per Session:   Stress:   . Feeling of Stress :   Social Connections:   . Frequency of Communication with Friends and Family:   . Frequency of Social Gatherings with Friends and Family:   . Attends Religious Services:   . Active Member of Clubs or Organizations:   . Attends Banker Meetings:   Marland Kitchen Marital Status:   Intimate Partner Violence:   . Fear of Current or Ex-Partner:   . Emotionally Abused:   Marland Kitchen Physically Abused:   . Sexually Abused:     Family History:   Family History  Problem Relation Age of Onset  . Hypertension Father   . Cancer Father 71       brain    . Cancer Mother    Family Status:  Family Status  Relation Name Status  . Father  Deceased  . Mother  Deceased    ROS:  Please see the history of present illness.  All other ROS reviewed and negative.     Physical Exam/Data:   Vitals:   01/30/20 0730 01/30/20 0828 01/30/20 1000 01/30/20 1008  BP: (!) 180/107 (!) 182/93 (!) 186/107   Pulse: 81 87 85 85  Resp: 15 18  (!) 21  Temp:      TempSrc:      SpO2: 96% 95% 97% 98%  Weight:      Height:       No intake or output data in the 24 hours ending 01/30/20 1104  Last 3 Weights 01/30/2020  Weight (lbs) 260 lb  Weight (kg) 117.935 kg     Body mass index is 39.53 kg/m.   General:  Well nourished, well developed, male in no acute distress HEENT: normal Lymph: no adenopathy Neck: JVD -not elevated Endocrine:  No thryomegaly Vascular: No carotid bruits; 4/4 extremity pulses 2+  Cardiac:  normal S1, S2; RRR; no murmur Lungs:  clear bilaterally, no wheezing, rhonchi or rales  Abd: soft, nontender, no hepatomegaly  Ext: no edema Musculoskeletal:  No deformities, BUE and BLE strength normal, LUE weakness Skin: warm and dry  Neuro:  CNs 2-12 intact, still some difficulties w/ speech Psych:  Normal affect   EKG:  The EKG was personally reviewed and demonstrates:  SR, HR 98, Delta wave seen, no old available Telemetry:  Telemetry was personally reviewed and demonstrates:  SR, ST   CV studies:   ECHO: done, results pending  Laboratory Data:   Chemistry Recent Labs  Lab 01/30/20 0039 01/30/20 0048  NA 133* 135  K 4.0 3.9  CL 98 99  CO2 24  --   GLUCOSE 300* 301*  BUN 11 12  CREATININE 0.82 0.60*  CALCIUM 9.4  --   GFRNONAA >60  --   GFRAA >60  --   ANIONGAP 11  --     Lab Results  Component Value Date   ALT 40 01/30/2020   AST 26 01/30/2020   ALKPHOS 95 01/30/2020   BILITOT 1.1 01/30/2020   Hematology Recent Labs  Lab 01/30/20 0039 01/30/20 0048  WBC 13.4*  --   RBC 5.60  --   HGB 17.5* 17.0   HCT 49.5 50.0  MCV 88.4  --   MCH 31.3  --   MCHC 35.4  --   RDW 11.5  --   PLT 234  --  Cardiac Enzymes High Sensitivity Troponin:  No results for input(s): TROPONINIHS in the last 720 hours.    BNPNo results for input(s): BNP, PROBNP in the last 168 hours.  DDimer No results for input(s): DDIMER in the last 168 hours. TSH: No results found for: TSH Lipids: Lab Results  Component Value Date   CHOL 295 (H) 01/30/2020   HDL 65 01/30/2020   LDLCALC 197 (H) 01/30/2020   TRIG 164 (H) 01/30/2020   CHOLHDL 4.5 01/30/2020   HgbA1c: Lab Results  Component Value Date   HGBA1C 11.0 (H) 01/30/2020   Magnesium: No results found for: MG   Radiology/Studies:  CT ANGIO HEAD W OR WO CONTRAST  Result Date: 01/30/2020 CLINICAL DATA:  Neuro deficit, acute, stroke suspected EXAM: CT ANGIOGRAPHY HEAD AND NECK TECHNIQUE: Multidetector CT imaging of the head and neck was performed using the standard protocol during bolus administration of intravenous contrast. Multiplanar CT image reconstructions and MIPs were obtained to evaluate the vascular anatomy. Carotid stenosis measurements (when applicable) are obtained utilizing NASCET criteria, using the distal internal carotid diameter as the denominator. CONTRAST:  OMNIPAQUE IOHEXOL 350 MG/ML SOLN COMPARISON:  None. FINDINGS: CTA NECK FINDINGS SKELETON: There is no bony spinal canal stenosis. No lytic or blastic lesion. OTHER NECK: Normal pharynx, larynx and major salivary glands. No cervical lymphadenopathy. Unremarkable thyroid gland. UPPER CHEST: No pneumothorax or pleural effusion. No nodules or masses. AORTIC ARCH: There is no calcific atherosclerosis of the aortic arch. There is no aneurysm, dissection or hemodynamically significant stenosis of the visualized portion of the aorta. Conventional 3 vessel aortic branching pattern. The visualized proximal subclavian arteries are widely patent. RIGHT CAROTID SYSTEM: Normal without aneurysm,  dissection or stenosis. LEFT CAROTID SYSTEM: Normal without aneurysm, dissection or stenosis. VERTEBRAL ARTERIES: Right dominant configuration. Both origins are clearly patent. There is no dissection, occlusion or flow-limiting stenosis to the skull base (V1-V3 segments). CTA HEAD FINDINGS POSTERIOR CIRCULATION: --Vertebral arteries: Atherosclerotic calcification narrows the left V4 segment. The right is normal. --Inferior cerebellar arteries: Normal. --Basilar artery: Normal. --Superior cerebellar arteries: Normal. --Posterior cerebral arteries (PCA): Normal. ANTERIOR CIRCULATION: --Intracranial internal carotid arteries: There is moderate stenosis of the lacerum segment of the left internal carotid artery secondary to noncalcified plaque. Otherwise, there is bilateral atherosclerotic calcification with mild multifocal stenosis. --Anterior cerebral arteries (ACA): Normal. Both A1 segments are present. Patent anterior communicating artery (a-comm). --Middle cerebral arteries (MCA): Normal. VENOUS SINUSES: As permitted by contrast timing, patent. ANATOMIC VARIANTS: None Review of the MIP images confirms the above findings. IMPRESSION: 1. No emergent large vessel occlusion. 2. Moderate stenosis of the lacerum segment of the left internal carotid artery secondary to noncalcified plaque. 3. Mild stenosis of the left vertebral artery V4 segment. 4. Aortic Atherosclerosis (ICD10-I70.0). Electronically Signed   By: Deatra Robinson M.D.   On: 01/30/2020 02:13   DG Chest 2 View  Result Date: 01/30/2020 CLINICAL DATA:  Right-sided numbness EXAM: CHEST - 2 VIEW COMPARISON:  None. FINDINGS: The heart size and mediastinal contours are within normal limits. Both lungs are clear. The visualized skeletal structures are unremarkable. IMPRESSION: No active cardiopulmonary disease. Electronically Signed   By: Deatra Robinson M.D.   On: 01/30/2020 02:36   CT ANGIO NECK W OR WO CONTRAST  Result Date: 01/30/2020 CLINICAL DATA:  Neuro  deficit, acute, stroke suspected EXAM: CT ANGIOGRAPHY HEAD AND NECK TECHNIQUE: Multidetector CT imaging of the head and neck was performed using the standard protocol during bolus administration of intravenous contrast. Multiplanar CT  image reconstructions and MIPs were obtained to evaluate the vascular anatomy. Carotid stenosis measurements (when applicable) are obtained utilizing NASCET criteria, using the distal internal carotid diameter as the denominator. CONTRAST:  100mL OMNIPAQUE IOHEXOL 350 MG/ML SOLN COMPARISON:  None. FINDINGS: CTA NECK FINDINGS SKELETON: There is no bony spinal canal stenosis. No lytic or blastic lesion. OTHER NECK: Normal pharynx, larynx and major salivary glands. No cervical lymphadenopathy. Unremarkable thyroid gland. UPPER CHEST: No pneumothorax or pleural effusion. No nodules or masses. AORTIC ARCH: There is no calcific atherosclerosis of the aortic arch. There is no aneurysm, dissection or hemodynamically significant stenosis of the visualized portion of the aorta. Conventional 3 vessel aortic branching pattern. The visualized proximal subclavian arteries are widely patent. RIGHT CAROTID SYSTEM: Normal without aneurysm, dissection or stenosis. LEFT CAROTID SYSTEM: Normal without aneurysm, dissection or stenosis. VERTEBRAL ARTERIES: Right dominant configuration. Both origins are clearly patent. There is no dissection, occlusion or flow-limiting stenosis to the skull base (V1-V3 segments). CTA HEAD FINDINGS POSTERIOR CIRCULATION: --Vertebral arteries: Atherosclerotic calcification narrows the left V4 segment. The right is normal. --Inferior cerebellar arteries: Normal. --Basilar artery: Normal. --Superior cerebellar arteries: Normal. --Posterior cerebral arteries (PCA): Normal. ANTERIOR CIRCULATION: --Intracranial internal carotid arteries: There is moderate stenosis of the lacerum segment of the left internal carotid artery secondary to noncalcified plaque. Otherwise, there is  bilateral atherosclerotic calcification with mild multifocal stenosis. --Anterior cerebral arteries (ACA): Normal. Both A1 segments are present. Patent anterior communicating artery (a-comm). --Middle cerebral arteries (MCA): Normal. VENOUS SINUSES: As permitted by contrast timing, patent. ANATOMIC VARIANTS: None Review of the MIP images confirms the above findings. IMPRESSION: 1. No emergent large vessel occlusion. 2. Moderate stenosis of the lacerum segment of the left internal carotid artery secondary to noncalcified plaque. 3. Mild stenosis of the left vertebral artery V4 segment. 4. Aortic Atherosclerosis (ICD10-I70.0). Electronically Signed   By: Deatra RobinsonKevin  Herman M.D.   On: 01/30/2020 02:13   MR BRAIN WO CONTRAST  Result Date: 01/30/2020 CLINICAL DATA:  Stroke follow-up EXAM: MRI HEAD WITHOUT CONTRAST TECHNIQUE: Multiplanar, multiecho pulse sequences of the brain and surrounding structures were obtained without intravenous contrast. COMPARISON:  Brain MRI 02/26/2005 Head CT 01/30/2020 FINDINGS: Brain: Small acute infarct of the anterolateral left thalamus. Normal white matter signal. Normal volume of CSF spaces. No chronic microhemorrhage. Normal midline structures. Vascular: Normal flow voids. Skull and upper cervical spine: Normal marrow signal. Sinuses/Orbits: Negative. Other: None. IMPRESSION: Small acute infarct of the anterolateral left thalamus. No acute hemorrhage or mass effect. Electronically Signed   By: Deatra RobinsonKevin  Herman M.D.   On: 01/30/2020 01:51   CT HEAD CODE STROKE WO CONTRAST  Result Date: 01/30/2020 CLINICAL DATA:  Code stroke. 49 year old male with right side numbness and difficulty speaking last seen normal 1500 hours. EXAM: CT HEAD WITHOUT CONTRAST TECHNIQUE: Contiguous axial images were obtained from the base of the skull through the vertex without intravenous contrast. COMPARISON:  Brain MRI 02/26/2005.  Head CT 02/11/2005. FINDINGS: Brain: Cerebral volume appears normal. No midline  shift, ventriculomegaly, mass effect, evidence of mass lesion, intracranial hemorrhage or evidence of cortically based acute infarction. Subtle new hypodensity in the left lateral thalamus near the posterior limb of the left internal capsule. Otherwise gray-white matter differentiation is within normal limits throughout the brain. Vascular: Calcified atherosclerosis at the skull base. No suspicious intracranial vascular hyperdensity. Skull: Chronic appearing periapical lucency at the posterior right maxilla, likely dental related. No acute osseous abnormality identified. Sinuses/Orbits: Paranasal sinuses, tympanic cavities and mastoids are clear. Other: Slight  rightward gaze. Otherwise negative orbit and scalp soft tissues. ASPECTS [facility] Stroke Program Early CT Score) Total score (0-10 with 10 being normal): 10 IMPRESSION: 1. Acute lacunar infarct suspected at the lateral left thalamus. No associated hemorrhage or mass effect. 2. Elsewhere stable and negative noncontrast CT appearance of the brain. ASPECTS 10. 3. These results were communicated to Dr. Laurence Slate at 12:23 am on 01/30/2020 by text page via the [facility] messaging system. Electronically Signed   By: Odessa Fleming M.D.   On: 01/30/2020 00:24    Assessment and Plan:   1. WPW: -He has had no symptoms attributable to this -There are no old ECGs for comparison. -Discuss appropriate medical therapy with MD  2.  CVA: -He has been started on aspirin, Plavix, high-dose statin -Permissive hypertension up to 220/120, so will not add any BP medications  Otherwise, per IM and Neurology Principal Problem:   Acute ischemic stroke (HCC) Active Problems:   DM2 (diabetes mellitus, type 2) (HCC)   HTN (hypertension)   Wolff-Parkinson-White (WPW) pattern seen on electrocardiography     For questions or updates, please contact [facility] Please consult [email or web address] for contact info under Cardiology/STEMI.   Signed, Theodore Demark, PA-C  01/30/2020 11:04  AM   History and all data above reviewed.  Patient examined.  I agree with the findings as above.  He has no prior cardiac history.  He has poorly controlled cardiovascular risk factors.  He really has not followed with medical management or office visits for control of his hypertension and his diabetes.  His A1c is 11.  He presents with a CVA as above.  Said symptoms develop suddenly last night.  He has residual right-sided weakness and some slurred speech.  He is incidentally found to have WPW on an EKG.  He has never been told this in the past and thinks he might of had one EKG sometime ago.  He has never really had any tachypalpitations, presyncope or syncope.  He has never had any shortness of breath, PND or orthopnea.  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  He has had no limitations with this.  In the emergency room he has been in sinus rhythm.  He has been hypertensive.  The patient exam reveals COR: Regular rate and rhythm no murmurs,  Lungs: Clear to auscultation bilaterally,  Abd: Positive bowel sounds normal in frequency in pitch, Ext 2+ pulses throughout, no edema..  All available labs, radiology testing, previous records reviewed. Agree with documented assessment and plan.   WPW: This is an incidental finding.  He does not have syndrome with no history of tachypalpitations.  Given this I think this is true true and unrelated.  He will get an echocardiogram.  He will need follow-up outpatient monitoring likely with a 4-week event monitor.  Otherwise usual work-up for CVA per neurology and the primary service.  Hypertension: Patient has had poorly controlled hypertension.  I suggest ACE inhibitor/ARB as first-line therapy.  Slight permissive hypertension per neurology.   DM: He will need aggressive education and management per primary care. .  Tobacco abuse: He thinks he can quit smoking.  Fayrene Fearing Zayveon Raschke  11:39 AM  01/30/2020

## 2020-01-30 NOTE — ED Notes (Signed)
Lunch Tray Ordered @ 1033. 

## 2020-01-30 NOTE — ED Notes (Signed)
PT in room to eval PT. 

## 2020-01-30 NOTE — ED Notes (Signed)
TC to [address] to give report on new orders for BP control.

## 2020-01-30 NOTE — ED Triage Notes (Addendum)
Pt to ED via [facility] with c/o right sided numbness onset approx 15:00 Sunday.  On arrival to ED pt alert and oriented x's 3. Pt has hx of HTN but st's he doesn't take his HTN MEDS  Pt admits to drinking 2 beers earlier today

## 2020-01-30 NOTE — Progress Notes (Signed)
  Echocardiogram 2D Echocardiogram has been performed.  Peter Tran 01/30/2020, 9:40 AM

## 2020-01-30 NOTE — H&P (Signed)
History and Physical    Peter Tran DOB: [birth year] DOA: 01/30/2020  PCP: No primary care provider on file.  Patient coming from: Home  I have personally briefly reviewed patient's old medical records in [facility]  Chief Complaint: R sided numbness, aphasia  HPI: Peter Tran is a 49 y.o. male with medical history significant of DM2, HTN, smoking.  Pt hasnt seen doctor in 4 years.  Pt presents to ED with onset of R sided numbness, difficult with speech described as "difficulty getting words out".  LKW 3pm.  Pt got off from worked, called brother, noticed speech was off.  EMS called, BP with EMS 260/160.   ED Course: NIHSS 1 on arrival.  Stat CT head showed early ischemic changes in left thalamus.  MRI confirms small acute ischemic stroke here.  CBG 300.  EKG shows a WPW pattern.   Review of Systems: As per HPI, otherwise all review of systems negative.  Past Medical History:  Diagnosis Date  . Diabetes mellitus without complication (HCC)   . Hypertension     History reviewed. No pertinent surgical history.   reports that he has been smoking. He has never used smokeless tobacco. He reports current alcohol use. He reports that he does not use drugs.  No Known Allergies  No family history on file.   Prior to Admission medications   Not on File    Physical Exam: Vitals:   01/30/20 0033 01/30/20 0059 01/30/20 0102 01/30/20 0208  BP:  (!) 209/129 (!) 192/106 (!) 181/94  Pulse:  92 90 91  Resp:  19 20 15   Temp:  (!) 97 F (36.1 C)    TempSrc:  Temporal    SpO2:  94% 95% 97%  Weight: 117.9 kg     Height: 5\' 8"  (1.727 m)       Constitutional: NAD, calm, comfortable Eyes: PERRL, lids and conjunctivae normal ENMT: Mucous membranes are moist. Posterior pharynx clear of any exudate or lesions.Normal dentition.  Neck: normal, supple, no masses, no thyromegaly Respiratory: clear to auscultation bilaterally, no wheezing, no crackles. Normal  respiratory effort. No accessory muscle use.  Cardiovascular: Regular rate and rhythm, no murmurs / rubs / gallops. No extremity edema. 2+ pedal pulses. No carotid bruits.  Abdomen: no tenderness, no masses palpated. No hepatosplenomegaly. Bowel sounds positive.  Musculoskeletal: no clubbing / cyanosis. No joint deformity upper and lower extremities. Good ROM, no contractures. Normal muscle tone.  Skin: no rashes, lesions, ulcers. No induration Neurologic: CN 2-12 grossly intact. Sensation intact, DTR normal. Strength 5/5 in all 4.  Psychiatric: Normal judgment and insight. Alert and oriented x 3. Normal mood.    Labs on Admission: I have personally reviewed following labs and imaging studies  CBC: Recent Labs  Lab 01/30/20 0039 01/30/20 0048  WBC 13.4*  --   NEUTROABS 10.4*  --   HGB 17.5* 17.0  HCT 49.5 50.0  MCV 88.4  --   PLT 234  --    Basic Metabolic Panel: Recent Labs  Lab 01/30/20 0039 01/30/20 0048  NA 133* 135  K 4.0 3.9  CL 98 99  CO2 24  --   GLUCOSE 300* 301*  BUN 11 12  CREATININE 0.82 0.60*  CALCIUM 9.4  --    GFR: Estimated Creatinine Clearance: 137.8 mL/min (A) (by C-G formula based on SCr of 0.6 mg/dL (L)). Liver Function Tests: Recent Labs  Lab 01/30/20 0039  AST 26  ALT 40  ALKPHOS 95  BILITOT 1.1  PROT 7.4  ALBUMIN 3.9   No results for input(s): LIPASE, AMYLASE in the last 168 hours. No results for input(s): AMMONIA in the last 168 hours. Coagulation Profile: Recent Labs  Lab 01/30/20 0039  INR 1.0   Cardiac Enzymes: No results for input(s): CKTOTAL, CKMB, CKMBINDEX, TROPONINI in the last 168 hours. BNP (last 3 results) No results for input(s): PROBNP in the last 8760 hours. HbA1C: No results for input(s): HGBA1C in the last 72 hours. CBG: No results for input(s): GLUCAP in the last 168 hours. Lipid Profile: No results for input(s): CHOL, HDL, LDLCALC, TRIG, CHOLHDL, LDLDIRECT in the last 72 hours. Thyroid Function Tests: No  results for input(s): TSH, T4TOTAL, FREET4, T3FREE, THYROIDAB in the last 72 hours. Anemia Panel: No results for input(s): VITAMINB12, FOLATE, FERRITIN, TIBC, IRON, RETICCTPCT in the last 72 hours. Urine analysis: No results found for: COLORURINE, APPEARANCEUR, LABSPEC, PHURINE, GLUCOSEU, HGBUR, BILIRUBINUR, KETONESUR, PROTEINUR, UROBILINOGEN, NITRITE, LEUKOCYTESUR  Radiological Exams on Admission: CT ANGIO HEAD W OR WO CONTRAST  Result Date: 01/30/2020 CLINICAL DATA:  Neuro deficit, acute, stroke suspected EXAM: CT ANGIOGRAPHY HEAD AND NECK TECHNIQUE: Multidetector CT imaging of the head and neck was performed using the standard protocol during bolus administration of intravenous contrast. Multiplanar CT image reconstructions and MIPs were obtained to evaluate the vascular anatomy. Carotid stenosis measurements (when applicable) are obtained utilizing NASCET criteria, using the distal internal carotid diameter as the denominator. CONTRAST:  OMNIPAQUE IOHEXOL 350 MG/ML SOLN COMPARISON:  None. FINDINGS: CTA NECK FINDINGS SKELETON: There is no bony spinal canal stenosis. No lytic or blastic lesion. OTHER NECK: Normal pharynx, larynx and major salivary glands. No cervical lymphadenopathy. Unremarkable thyroid gland. UPPER CHEST: No pneumothorax or pleural effusion. No nodules or masses. AORTIC ARCH: There is no calcific atherosclerosis of the aortic arch. There is no aneurysm, dissection or hemodynamically significant stenosis of the visualized portion of the aorta. Conventional 3 vessel aortic branching pattern. The visualized proximal subclavian arteries are widely patent. RIGHT CAROTID SYSTEM: Normal without aneurysm, dissection or stenosis. LEFT CAROTID SYSTEM: Normal without aneurysm, dissection or stenosis. VERTEBRAL ARTERIES: Right dominant configuration. Both origins are clearly patent. There is no dissection, occlusion or flow-limiting stenosis to the skull base (V1-V3 segments). CTA HEAD  FINDINGS POSTERIOR CIRCULATION: --Vertebral arteries: Atherosclerotic calcification narrows the left V4 segment. The right is normal. --Inferior cerebellar arteries: Normal. --Basilar artery: Normal. --Superior cerebellar arteries: Normal. --Posterior cerebral arteries (PCA): Normal. ANTERIOR CIRCULATION: --Intracranial internal carotid arteries: There is moderate stenosis of the lacerum segment of the left internal carotid artery secondary to noncalcified plaque. Otherwise, there is bilateral atherosclerotic calcification with mild multifocal stenosis. --Anterior cerebral arteries (ACA): Normal. Both A1 segments are present. Patent anterior communicating artery (a-comm). --Middle cerebral arteries (MCA): Normal. VENOUS SINUSES: As permitted by contrast timing, patent. ANATOMIC VARIANTS: None Review of the MIP images confirms the above findings. IMPRESSION: 1. No emergent large vessel occlusion. 2. Moderate stenosis of the lacerum segment of the left internal carotid artery secondary to noncalcified plaque. 3. Mild stenosis of the left vertebral artery V4 segment. 4. Aortic Atherosclerosis (ICD10-I70.0). Electronically Signed   By: Deatra Robinson M.D.   On: 01/30/2020 02:13   CT ANGIO NECK W OR WO CONTRAST  Result Date: 01/30/2020 CLINICAL DATA:  Neuro deficit, acute, stroke suspected EXAM: CT ANGIOGRAPHY HEAD AND NECK TECHNIQUE: Multidetector CT imaging of the head and neck was performed using the standard protocol during bolus administration of intravenous contrast. Multiplanar CT image reconstructions and MIPs  were obtained to evaluate the vascular anatomy. Carotid stenosis measurements (when applicable) are obtained utilizing NASCET criteria, using the distal internal carotid diameter as the denominator. CONTRAST:  100mL OMNIPAQUE IOHEXOL 350 MG/ML SOLN COMPARISON:  None. FINDINGS: CTA NECK FINDINGS SKELETON: There is no bony spinal canal stenosis. No lytic or blastic lesion. OTHER NECK: Normal pharynx, larynx  and major salivary glands. No cervical lymphadenopathy. Unremarkable thyroid gland. UPPER CHEST: No pneumothorax or pleural effusion. No nodules or masses. AORTIC ARCH: There is no calcific atherosclerosis of the aortic arch. There is no aneurysm, dissection or hemodynamically significant stenosis of the visualized portion of the aorta. Conventional 3 vessel aortic branching pattern. The visualized proximal subclavian arteries are widely patent. RIGHT CAROTID SYSTEM: Normal without aneurysm, dissection or stenosis. LEFT CAROTID SYSTEM: Normal without aneurysm, dissection or stenosis. VERTEBRAL ARTERIES: Right dominant configuration. Both origins are clearly patent. There is no dissection, occlusion or flow-limiting stenosis to the skull base (V1-V3 segments). CTA HEAD FINDINGS POSTERIOR CIRCULATION: --Vertebral arteries: Atherosclerotic calcification narrows the left V4 segment. The right is normal. --Inferior cerebellar arteries: Normal. --Basilar artery: Normal. --Superior cerebellar arteries: Normal. --Posterior cerebral arteries (PCA): Normal. ANTERIOR CIRCULATION: --Intracranial internal carotid arteries: There is moderate stenosis of the lacerum segment of the left internal carotid artery secondary to noncalcified plaque. Otherwise, there is bilateral atherosclerotic calcification with mild multifocal stenosis. --Anterior cerebral arteries (ACA): Normal. Both A1 segments are present. Patent anterior communicating artery (a-comm). --Middle cerebral arteries (MCA): Normal. VENOUS SINUSES: As permitted by contrast timing, patent. ANATOMIC VARIANTS: None Review of the MIP images confirms the above findings. IMPRESSION: 1. No emergent large vessel occlusion. 2. Moderate stenosis of the lacerum segment of the left internal carotid artery secondary to noncalcified plaque. 3. Mild stenosis of the left vertebral artery V4 segment. 4. Aortic Atherosclerosis (ICD10-I70.0). Electronically Signed   By: Deatra RobinsonKevin  Herman M.D.    On: 01/30/2020 02:13   MR BRAIN WO CONTRAST  Result Date: 01/30/2020 CLINICAL DATA:  Stroke follow-up EXAM: MRI HEAD WITHOUT CONTRAST TECHNIQUE: Multiplanar, multiecho pulse sequences of the brain and surrounding structures were obtained without intravenous contrast. COMPARISON:  Brain MRI 02/26/2005 Head CT 01/30/2020 FINDINGS: Brain: Small acute infarct of the anterolateral left thalamus. Normal white matter signal. Normal volume of CSF spaces. No chronic microhemorrhage. Normal midline structures. Vascular: Normal flow voids. Skull and upper cervical spine: Normal marrow signal. Sinuses/Orbits: Negative. Other: None. IMPRESSION: Small acute infarct of the anterolateral left thalamus. No acute hemorrhage or mass effect. Electronically Signed   By: Deatra RobinsonKevin  Herman M.D.   On: 01/30/2020 01:51   CT HEAD CODE STROKE WO CONTRAST  Result Date: 01/30/2020 CLINICAL DATA:  Code stroke. 49 year old male with right side numbness and difficulty speaking last seen normal 1500 hours. EXAM: CT HEAD WITHOUT CONTRAST TECHNIQUE: Contiguous axial images were obtained from the base of the skull through the vertex without intravenous contrast. COMPARISON:  Brain MRI 02/26/2005.  Head CT 02/11/2005. FINDINGS: Brain: Cerebral volume appears normal. No midline shift, ventriculomegaly, mass effect, evidence of mass lesion, intracranial hemorrhage or evidence of cortically based acute infarction. Subtle new hypodensity in the left lateral thalamus near the posterior limb of the left internal capsule. Otherwise gray-white matter differentiation is within normal limits throughout the brain. Vascular: Calcified atherosclerosis at the skull base. No suspicious intracranial vascular hyperdensity. Skull: Chronic appearing periapical lucency at the posterior right maxilla, likely dental related. No acute osseous abnormality identified. Sinuses/Orbits: Paranasal sinuses, tympanic cavities and mastoids are clear. Other: Slight rightward gaze.  Otherwise  negative orbit and scalp soft tissues. ASPECTS [facility] Stroke Program Early CT Score) Total score (0-10 with 10 being normal): 10 IMPRESSION: 1. Acute lacunar infarct suspected at the lateral left thalamus. No associated hemorrhage or mass effect. 2. Elsewhere stable and negative noncontrast CT appearance of the brain. ASPECTS 10. 3. These results were communicated to Dr. Laurence Slate at 12:23 am on 01/30/2020 by text page via the [facility] messaging system. Electronically Signed   By: Odessa Fleming M.D.   On: 01/30/2020 00:24    EKG: Independently reviewed.  Assessment/Plan Principal Problem:   Acute ischemic stroke (HCC) Active Problems:   DM2 (diabetes mellitus, type 2) (HCC)   HTN (hypertension)   Wolff-Parkinson-White (WPW) pattern seen on electrocardiography    1. Acute ischemic stroke - 1. Stroke pathway 2. Neuro consult 3. Tele monitor 4. 2d echo 5. CTA head and neck 6. PT/OT/SLP 7. ASA 81 + plavix 2. DM2 - 1. SSI mod scale AC/HS 3. HTN - 1. Not on any home BP meds 2. Allowing permissive HTN up to 220/120 as per neuro consult rec 4. WPW pattern - 1. No h/o syncope nor random palpitations per patient 2. Will send message to P. Johna Sheriff to have cards take a look at this and see if they have any recs.  Though it sounds like he is just asymptomatic WPW pattern at this point.  DVT prophylaxis: Lovenox Code Status: Full Family Communication: No family in room Disposition Plan: Home after admit Consults called: Message sent to P. Trent for cards eval in AM Admission status: Place in [place]   Brycelyn Gambino, [place] M. DO [facility]  How to contact the [facility] Attending or Consulting provider 7A - 7P or covering provider during after hours 7P -7A, for this patient?  1. Check the care team in [facility] and look for a) attending/consulting [facility] provider listed and b) the [facility] team listed 2. Log into [email or web address]  Amion Physician Scheduling and messaging for groups and whole hospitals  On call and  physician scheduling software for group practices, residents, hospitalists and other medical providers for call, clinic, rotation and shift schedules. On[employer] is a hospital-wide system for scheduling doctors and paging doctors on call. EasyPlot is for scientific plotting and data analysis.  [email or web address]  and use [facility]'s universal password to access. If you do not have the password, please contact the hospital operator.  3. Locate the [facility] provider you are looking for under [facility] and page to a number that you can be directly reached. 4. If you still have difficulty reaching the provider, please page the [facility] (Director on Call) for the Hospitalists listed on amion for assistance.  01/30/2020, 2:25 AM

## 2020-01-31 ENCOUNTER — Other Ambulatory Visit: Payer: Self-pay | Admitting: Medical

## 2020-01-31 ENCOUNTER — Telehealth: Payer: Self-pay | Admitting: Cardiology

## 2020-01-31 DIAGNOSIS — I639 Cerebral infarction, unspecified: Secondary | ICD-10-CM | POA: Diagnosis not present

## 2020-01-31 DIAGNOSIS — I456 Pre-excitation syndrome: Secondary | ICD-10-CM

## 2020-01-31 DIAGNOSIS — I10 Essential (primary) hypertension: Secondary | ICD-10-CM | POA: Diagnosis not present

## 2020-01-31 LAB — GLUCOSE, CAPILLARY
Glucose-Capillary: 201 mg/dL — ABNORMAL HIGH (ref 70–99)
Glucose-Capillary: 147 mg/dL — ABNORMAL HIGH (ref 70–99)
Glucose-Capillary: 135 mg/dL — ABNORMAL HIGH (ref 70–99)
Glucose-Capillary: 217 mg/dL — ABNORMAL HIGH (ref 70–99)

## 2020-01-31 MED ORDER — LISINOPRIL 10 MG PO TABS
10.0000 mg | ORAL_TABLET | Freq: Every day | ORAL | Status: DC
  Administered 2020-01-31 – 2020-02-06 (×7): 10 mg via ORAL
  Filled 2020-01-31 (×7): qty 1

## 2020-01-31 MED ORDER — LIVING WELL WITH DIABETES BOOK
Freq: Once | Status: AC
  Filled 2020-01-31: qty 1

## 2020-01-31 MED ORDER — INSULIN STARTER KIT- PEN NEEDLES (ENGLISH)
1.0000 | Freq: Once | Status: AC
  Administered 2020-01-31: 1
  Filled 2020-01-31 (×2): qty 1

## 2020-01-31 MED ORDER — GLIPIZIDE 5 MG PO TABS
5.0000 mg | ORAL_TABLET | Freq: Every day | ORAL | Status: DC
  Administered 2020-01-31 – 2020-02-02 (×3): 5 mg via ORAL
  Filled 2020-01-31 (×4): qty 1

## 2020-01-31 MED ORDER — AMLODIPINE BESYLATE 5 MG PO TABS
5.0000 mg | ORAL_TABLET | Freq: Every day | ORAL | Status: DC
  Administered 2020-01-31: 5 mg via ORAL
  Filled 2020-01-31: qty 1

## 2020-01-31 NOTE — Progress Notes (Signed)
°  Speech Language Pathology Treatment: Cognitive-Linquistic (Dysarthria)  Patient Details Name: Peter Tran MRN: [id number] DOB: [birth year] Today's Date: 01/31/2020 Time: 4970-2637 SLP Time Calculation (min) (ACUTE ONLY): 16 min  Assessment / Plan / Recommendation Clinical Impression  Pt was seen for dysarthria treatment and was cooperative during the session. He was educated regarding the nature of dysarthria, his deficits, and compensatory strategies to improve speech intelligibility. Dysarthria educational materials were provided and pt verbalized understanding regarding all areas of education. He used compensatory strategies at the paragraph level with 70% accuracy increasing to 100% accuracy with self correction and cues for speaking rate. SLP will continue to follow pt.    HPI HPI: Pt is a 49 y.o. male with medical history significant of DM2, HTN, smoking who presented to the ED with onset of R sided numbness and difficult with speech described as "difficulty getting words out". MRI brain: Small acute infarct of the anterolateral left thalamus. CTA negative for LVO.       SLP Plan  Continue with current plan of care  Patient needs continued Speech Lanaguage Pathology Services    Recommendations                   Follow up Recommendations: Home health SLP SLP Visit Diagnosis: Dysarthria and anarthria (R47.1);Cognitive communication deficit (R41.841) Plan: Continue with current plan of care       Esra Frankowski I. Vear Clock, MS, CCC-SLP Acute Rehabilitation Services Office number [phone] Pager [phone]                Scheryl Marten 01/31/2020, 12:31 PM

## 2020-01-31 NOTE — Progress Notes (Signed)
Rehab Admissions Coordinator Note:  Patient was screened by Clois Dupes for appropriateness for an Inpatient Acute Rehab Consult per therapy recommendation.   At this time, we are recommending Inpatient Rehab consult. I will place order per protocol.  Clois Dupes RN MSN 01/31/2020, 3:06 PM  I can be reached at [phone].

## 2020-01-31 NOTE — TOC Initial Note (Signed)
Transition of Care [facility]) - Initial/Assessment Note    Patient Details  Name: Peter Tran MRN: [id number] Date of Birth: 05-Oct-1970  Transition of Care [facility]) CM/SW Contact:    Kermit Balo, RN Phone Number: 01/31/2020, 3:41 PM  Clinical Narrative:                 Pt is from home alone and only has a neighbor that can check in on him. PT has changed their recommendation to CIR. CM is following.  Expected Discharge Plan: IP Rehab Facility Barriers to Discharge: Continued Medical Work up   Patient Goals and CMS Choice     Choice offered to / list presented to : Patient  Expected Discharge Plan and Services Expected Discharge Plan: IP Rehab Facility   Discharge Planning Services: CM Consult Post Acute Care Choice: IP Rehab Living arrangements for the past 2 months: Apartment                                      Prior Living Arrangements/Services Living arrangements for the past 2 months: Apartment Lives with:: Self Patient language and need for interpreter reviewed:: Yes Do you feel safe going back to the place where you live?: Yes      Need for Family Participation in Patient Care: Yes (Comment) Care giver support system in place?: No (comment)   Criminal Activity/Legal Involvement Pertinent to Current Situation/Hospitalization: No - Comment as needed  Activities of Daily Living Home Assistive Devices/Equipment: None ADL Screening (condition at time of admission) Patient's cognitive ability adequate to safely complete daily activities?: Yes Is the patient deaf or have difficulty hearing?: No Does the patient have difficulty seeing, even when wearing glasses/contacts?: No Does the patient have difficulty concentrating, remembering, or making decisions?: No Patient able to express need for assistance with ADLs?: Yes Does the patient have difficulty dressing or bathing?: No Independently performs ADLs?: Yes (appropriate for developmental age) Does the patient  have difficulty walking or climbing stairs?: No Weakness of Legs: None Weakness of Arms/Hands: None  Permission Sought/Granted                  Emotional Assessment Appearance:: Appears stated age Attitude/Demeanor/Rapport: Engaged Affect (typically observed): Accepting Orientation: : Oriented to Self, Oriented to Place, Oriented to  Time, Oriented to Situation   Psych Involvement: No (comment)  Admission diagnosis:  Abnormal CXR [R93.89] Acute ischemic stroke [facility]) [I63.9] Lacunar infarct, acute (HCC) [I63.81] Evelene Croon Parkinson White pattern seen on electrocardiogram [I45.6] Patient Active Problem List   Diagnosis Date Noted  . DM2 (diabetes mellitus, type 2) (HCC) 01/30/2020  . HTN (hypertension) 01/30/2020  . Wolff-Parkinson-White (WPW) pattern seen on electrocardiography 01/30/2020  . Acute ischemic stroke (HCC) 01/30/2020  . HEADACHE 03/31/2007   PCP:  Patient, No Pcp Per Pharmacy:  No Pharmacies Listed    Social Determinants of Health (SDOH) Interventions    Readmission Risk Interventions No flowsheet data found.

## 2020-01-31 NOTE — Consult Note (Addendum)
Physical Medicine and Rehabilitation Consult Reason for Consult: Right side numbness with weakness Referring Physician: [facility]   HPI: Peter Tran is a 49 y.o. right-handed male with history of hypertension, diabetes mellitus and tobacco abuse on no prescription medications.  History taken from chart review and patients. Per chart review patient lives alone independent prior to admission 1 level home with level entry.  He does have some neighbors who may be able to check on him.  Presented 01/30/2020 with right hemiparesis, numbness and blood pressure reading of 260/160.  CT/MRI showed small acute anterior left thalamic infarct.  No acute hemorrhage or mass-effect.  CT angiogram of head and neck with no emergent large vessel occlusion.  Patient did not receive TPA.  Admission chemistry sodium 133, glucose 300, hemoglobin A1c of 11, urine drug screen negative.  Echocardiogram with ejection fraction of 60%.  No wall motion abnormalities.  Currently maintained on aspirin and Plavix for CVA prophylaxis.  Subcutaneous Lovenox for DVT prophylaxis.  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  No arrhythmia noted on telemetry.  Plan to arrange 14-day Zio patch at discharge and follow-up outpatient.  Therapy evaluations completed with recommendations of physical medicine rehab consult.  Review of Systems  Constitutional: Positive for malaise/fatigue. Negative for chills and fever.  HENT: Negative for hearing loss.   Eyes: Negative for blurred vision and double vision.  Respiratory: Negative for cough and shortness of breath.   Cardiovascular: Negative for chest pain and palpitations.  Gastrointestinal: Positive for constipation. Negative for heartburn, nausea and vomiting.  Genitourinary: Negative for dysuria and hematuria.  Musculoskeletal: Positive for myalgias.  Skin: Negative for rash.  Neurological: Positive for dizziness, sensory change, speech change and weakness.  All  other systems reviewed and are negative.  Past Medical History:  Diagnosis Date  . Diabetes mellitus without complication (HCC)   . Hypertension    Past Surgical History:  Procedure Laterality Date  . HAND SURGERY Right    Family History  Problem Relation Age of Onset  . Hypertension Father   . Cancer Father 63       brain  . CAD Father        Stents  . Cancer Mother    Social History:  reports that he has been smoking. He has never used smokeless tobacco. He reports current alcohol use. He reports that he does not use drugs. Allergies: No Known Allergies No medications prior to admission.   Home: Home Living Family/patient expects to be discharged to:: Private residence Living Arrangements: Alone Available Help at Discharge: Family, Available PRN/intermittently Type of Home: House Home Access: Level entry Home Layout: One level Bathroom Shower/Tub: Engineer, manufacturing systems: Standard Home Equipment: None  Lives With: Alone  Functional History: Prior Function Level of Independence: Independent Comments: works as a Production designer, theatre/television/film at [employer] Status:  Mobility: Bed Mobility Overal bed mobility: Needs Assistance Bed Mobility: Supine to Sit, Sit to Supine Supine to sit: Supervision Sit to supine: Supervision General bed mobility comments: min guard for safety due to impulsivity and narrow gurney in ED Transfers Overall transfer level: Needs assistance Equipment used: None Transfers: Sit to/from Stand Sit to Stand: Min guard General transfer comment: Min guard for safety; pt report mild dizziness (VSS) Ambulation/Gait Ambulation/Gait assistance: Min guard Gait Distance (Feet): 100 Feet (100'x3 with standing rest breaks) Assistive device: None Gait Pattern/deviations: Decreased stride length, Ataxic General Gait Details: Reports "feels like drunk."   Noted ataxia  on R LE with hard heel strike.  Cued to take time, focus on R LE placement, and for  safety.  Pt with DOE of 3/4 , HR 130 bpm with walking, BPs were elevated but stable and within permissive HTN levels Gait velocity: decreased Stairs: Yes Stairs assistance: Min assist Stair Management: Two rails, Alternating pattern Number of Stairs: 5    ADL:    Cognition: Cognition Overall Cognitive Status: No family/caregiver present to determine baseline cognitive functioning Arousal/Alertness: Awake/alert Orientation Level: Oriented X4 Attention: Sustained, Focused Focused Attention: Appears intact Sustained Attention: Impaired Sustained Attention Impairment: Verbal complex Memory: Appears intact Awareness: Appears intact Problem Solving: Impaired Problem Solving Impairment: Verbal complex Executive Function: Organizing, Sequencing Sequencing: Impaired Sequencing Impairment: Verbal complex (Clock drawing: 0/4) Organizing: Impaired Organizing Impairment: Verbal complex (Backward digit span: 0/3) Cognition Arousal/Alertness: Awake/alert Behavior During Therapy: Impulsive Overall Cognitive Status: No family/caregiver present to determine baseline cognitive functioning Area of Impairment: Safety/judgement Safety/Judgement: Decreased awareness of safety Problem Solving: Requires verbal cues, Requires tactile cues General Comments: pt aware of deficits however remains impulsive  Blood pressure (!) 143/88, pulse 72, temperature 97.6 F (36.4 C), temperature source Oral, resp. rate 18, height 5\' 8"  (1.727 m), weight 117.9 kg, SpO2 99 %. Physical Exam Vitals reviewed.  Constitutional:      Appearance: He is obese.  HENT:     Head: Normocephalic and atraumatic.     Right Ear: External ear normal.     Left Ear: External ear normal.     Nose: Nose normal.  Eyes:     General:        Right eye: No discharge.        Left eye: No discharge.     Extraocular Movements: Extraocular movements intact.  Pulmonary:     Effort: Pulmonary effort is normal. No respiratory distress.       Breath sounds: No stridor.  Abdominal:     General: Abdomen is flat. There is no distension.     Palpations: Abdomen is soft.  Musculoskeletal:     Cervical back: Normal range of motion and neck supple.     Comments: No edema or tenderness in extremities  Skin:    General: Skin is warm and dry.  Neurological:     Mental Status: He is alert.     Comments: Alert Makes good eye contact with examiner follows commands.   Oriented x3 with fair awareness of deficits. Right sided numbness RUE ataxia Motor: RUE/RLE: 4+/5 proximal to distal LUE/LLE: 5/5 proximal to distal Mild expressive aphasia  Psychiatric:        Mood and Affect: Mood is anxious.        Speech: Speech is slurred.        Behavior: Behavior normal.     Results for orders placed or performed during the hospital encounter of 01/30/20 (from the past 24 hour(s))  Glucose, capillary     Status: Abnormal   Collection Time: 01/31/20  6:17 AM  Result Value Ref Range   Glucose-Capillary 201 (H) 70 - 99 mg/dL   Comment 1 Notify RN    Comment 2 Document in Chart   Glucose, capillary     Status: Abnormal   Collection Time: 01/31/20 12:15 PM  Result Value Ref Range   Glucose-Capillary 147 (H) 70 - 99 mg/dL  Glucose, capillary     Status: Abnormal   Collection Time: 01/31/20  4:25 PM  Result Value Ref Range   Glucose-Capillary 135 (H) 70 - 99  mg/dL  Glucose, capillary     Status: Abnormal   Collection Time: 01/31/20  9:14 PM  Result Value Ref Range   Glucose-Capillary 217 (H) 70 - 99 mg/dL   Comment 1 Notify RN    Comment 2 Document in Chart   Potassium     Status: None   Collection Time: 02/01/20  3:13 AM  Result Value Ref Range   Potassium 4.4 3.5 - 5.1 mmol/L  Magnesium     Status: None   Collection Time: 02/01/20  3:13 AM  Result Value Ref Range   Magnesium 2.0 1.7 - 2.4 mg/dL   ECHOCARDIOGRAM COMPLETE  Result Date: 01/30/2020    ECHOCARDIOGRAM REPORT   Patient Name:   Janace LittenJOE F Misko Date of Exam: 01/30/2020  Medical Rec #:  [id number]    Height:       68.0 in Accession #:    [id number]   Weight:       260.0 lb Date of Birth:  02/24/1971    BSA:          2.285 m Patient Age:    48 years     BP:           182/93 mmHg Patient Gender: M            HR:           79 bpm. Exam Location:  Inpatient Procedure: 2D Echo Indications:    stroke 434.91  History:        Patient has no prior history of Echocardiogram examinations.                 Risk Factors:Diabetes, Hypertension and Current Smoker.  Sonographer:    Celene SkeenVijay Shankar RDCS (AE) Referring Phys: [id number] JARED M GARDNER  Sonographer Comments: Patient is morbidly obese. see comments IMPRESSIONS  1. Left ventricular ejection fraction, by estimation, is 55 to 60%. The left ventricle has normal function. The left ventricle has no regional wall motion abnormalities. Left ventricular diastolic function could not be evaluated.  2. Right ventricular systolic function is normal. The right ventricular size is normal. Tricuspid regurgitation signal is inadequate for assessing PA pressure.  3. The mitral valve is degenerative. No evidence of mitral valve regurgitation. No evidence of mitral stenosis.  4. The aortic valve is tricuspid. Aortic valve regurgitation is not visualized. No aortic stenosis is present.  5. The inferior vena cava is normal in size with greater than 50% respiratory variability, suggesting right atrial pressure of 3 mmHg. Conclusion(s)/Recommendation(s): No intracardiac source of embolism detected on this transthoracic study. A transesophageal echocardiogram is recommended to exclude cardiac source of embolism if clinically indicated. FINDINGS  Left Ventricle: Left ventricular ejection fraction, by estimation, is 55 to 60%. The left ventricle has normal function. The left ventricle has no regional wall motion abnormalities. The left ventricular internal cavity size was normal in size. There is  no left ventricular hypertrophy. Left ventricular diastolic function could  not be evaluated due to mitral annular calcification (moderate or greater). Left ventricular diastolic function could not be evaluated. Right Ventricle: The right ventricular size is normal. No increase in right ventricular wall thickness. Right ventricular systolic function is normal. Tricuspid regurgitation signal is inadequate for assessing PA pressure. Left Atrium: Left atrial size was normal in size. Right Atrium: Right atrial size was normal in size. Pericardium: Trivial pericardial effusion is present. Mitral Valve: The mitral valve is degenerative in appearance. There is moderate calcification of the posterior mitral valve leaflet(s).  Moderate mitral annular calcification. No evidence of mitral valve regurgitation. No evidence of mitral valve stenosis. Tricuspid Valve: The tricuspid valve is grossly normal. Tricuspid valve regurgitation is not demonstrated. No evidence of tricuspid stenosis. Aortic Valve: The aortic valve is tricuspid. Aortic valve regurgitation is not visualized. No aortic stenosis is present. Pulmonic Valve: The pulmonic valve was grossly normal. Pulmonic valve regurgitation is not visualized. No evidence of pulmonic stenosis. Aorta: The aortic root is normal in size and structure. Venous: The inferior vena cava is normal in size with greater than 50% respiratory variability, suggesting right atrial pressure of 3 mmHg. IAS/Shunts: The atrial septum is grossly normal.  LEFT VENTRICLE PLAX 2D LVIDd:         4.40 cm  Diastology LVIDs:         3.00 cm  LV e' lateral:   8.59 cm/s LV PW:         1.00 cm  LV E/e' lateral: 12.8 LV IVS:        1.00 cm  LV e' medial:    6.64 cm/s LVOT diam:     2.20 cm  LV E/e' medial:  16.6 LV SV:         77 LV SV Index:   34 LVOT Area:     3.80 cm  LEFT ATRIUM           Index       RIGHT ATRIUM           Index LA diam:      3.40 cm 1.49 cm/m  RA Area:     17.90 cm LA Vol (A4C): 51.7 ml 22.63 ml/m RA Volume:   47.30 ml  20.70 ml/m  AORTIC VALVE LVOT Vmax:    111.00 cm/s LVOT Vmean:  72.500 cm/s LVOT VTI:    0.203 m  AORTA Ao Root diam: 3.10 cm MITRAL VALVE MV Area (PHT): 2.91 cm     SHUNTS MV Decel Time: 261 msec     Systemic VTI:  0.20 m MV E velocity: 110.00 cm/s  Systemic Diam: 2.20 cm MV A velocity: 114.00 cm/s MV E/A ratio:  0.96 Lennie Odor MD Electronically signed by Lennie Odor MD Signature Date/Time: 01/30/2020/1:03:04 PM    Final     Assessment/Plan: Diagnosis: Small acute anterior left thalamic infarct.   Stroke: Continue secondary stroke prophylaxis and Risk Factor Modification listed below:   Antiplatelet therapy:   Blood Pressure Management:  Continue current medication with prn's with permisive HTN per primary team Statin Agent:   Uncontrolled diabetes management:   Tobacco abuse:   Motor recovery: SSRI Labs independently reviewed.  Records reviewed and summated above.  1. Does the need for close, 24 hr/day medical supervision in concert with the patient's rehab needs make it unreasonable for this patient to be served in a less intensive setting? Potentially  2. Co-Morbidities requiring supervision/potential complications: HTN (monitor and provide prns in accordance with increased physical exertion and pain), DM (Monitor in accordance with exercise and adjust meds as necessary), tobacco abuse, WPW (EP recs) 3. Due to safety, disease management and patient education, does the patient require 24 hr/day rehab nursing? Potentially 4. Does the patient require coordinated care of a physician, rehab nurse, therapy disciplines of PT/OT to address physical and functional deficits in the context of the above medical diagnosis(es)? Potentially Addressing deficits in the following areas: balance, endurance, locomotion, strength, bathing, dressing and toileting 5. Can the patient actively participate in an intensive therapy program of at  least 3 hrs of therapy per day at least 5 days per week? Yes 6. The potential for patient to make measurable  gains while on inpatient rehab is good and fair 7. Anticipated functional outcomes upon discharge from inpatient rehab are modified independent  with PT, modified independent with OT, n/a with SLP. 8. Estimated rehab length of stay to reach the above functional goals is: 4-6 days. 9. Anticipated discharge destination: Home 10. Overall Rehab/Functional Prognosis: good  RECOMMENDATIONS: This patient's condition is appropriate for continued rehabilitative care in the following setting: Patient making good functional gains over the last 2 therapy sessions, anticipate he will not require CIR, however he does not progress to supervision level of functioning recommend CIR. Recommend outpatient PM&R follow up. Patient has agreed to participate in recommended program. Potentially Note that insurance prior authorization may be required for reimbursement for recommended care.  Comment: Rehab Admissions Coordinator to follow up.  I have personally performed a face to face diagnostic evaluation, including, but not limited to relevant history and physical exam findings, of this patient and developed relevant assessment and plan.  Additionally, I have reviewed and concur with the physician assistant's documentation above.   Maryla Morrow, MD, ABPMR Mcarthur Rossetti Angiulli, PA-C 02/01/2020

## 2020-01-31 NOTE — Telephone Encounter (Signed)
Left message for patient to call and schedule hospital f/u appt

## 2020-01-31 NOTE — Progress Notes (Signed)
PROGRESS NOTE  Peter Tran  DOB: 1971-05-24  PCP: Patient, No Pcp Per WGY:[id number]  DOA: 01/30/2020  LOS: 1 day   No chief complaint on file.  Brief narrative: Peter Tran is a 49 y.o. male with PMH of colitis and hypertension who had not seen a physician for last 4 years and was not taking any medicine at home. Patient presented to the ED on 01/30/2020 with complaint of headache, right-sided numbness and word finding difficulties. EMS noted blood pressure to be 260/160. Brought to ED as a stroke alert. Stat CT head showed acute lacunar infarct at the lateral left thalamus. MRI confirmed CT finding. CT angio of head and neck did not show any emergent large vessel occlusion.  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 Seen by neurology. Admitted to hospital service for stroke work-up.  Subjective: Patient was seen and examined this afternoon. Sitting up at the edge of the bed.  Getting evaluated by PT. Anxious because he does not have support at home. Blood pressure elevated up to 170s, fasting blood sugar 201 this morning.  Assessment/Plan: Acute ischemic stroke-left lateral thalamus -Presented with right-sided numbness, headache and word finding difficulty. -CT and MRI finding as above showing acute infarct of lateral left thrombus -Stroke work-up started. -LDL 197, HDL 65, A1c 11 -echocardiogram with EF 55 to 60% and no intracardiac source of embolism. -Neurology consultation appreciated. -PT/OT/ST eval appreciated. -Started on dual antiplatelet therapy with aspirin 81 mg daily and Plavix 75 mg daily and Lipitor 80 mg daily.  Uncontrolled diabetes mellitus type 2 - A1c 11 -Currently on Lantus 15 units daily.  I added glipizide 5 mg daily this morning. -Continue sliding scale insulin Accu-Cheks. -Diabetes coordinator consult appreciated.  Hypertensive urgency -Not on blood pressure at home.  Presented with a blood pressure of 209/129 -Permissive  hypertension allowed for first 24 hours. -I started him on lisinopril 10 mg daily and amlodipine 5 mg daily this morning.  Continue to monitor blood pressure.  Likely needs further adjustment of medication.  Dyslipidemia -LDL 197, HDL 65 -Started on Lipitor 80 mg daily.  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 -No h/o syncope or palpitations per patient. -Cardiology consult appreciated. -Echocardiogram with normal EF and no congenital defect. -Cardiology plans for Zio patch as an outpatient and follow-up with Dr. Percival Spanish.  Mobility: PT eval appreciated.  Code Status:   Code Status: Full Code  Nutritional status: Body mass index is 39.53 kg/m.     Diet Order            Diet Carb Modified Fluid consistency: Thin; Room service appropriate? Yes  Diet effective now                 DVT prophylaxis: enoxaparin (LOVENOX) injection 40 mg Start: 01/30/20 1000   Antimicrobials:  None Fluid: None  Consultants: Neurology, cardiology Family Communication:  None at bedside  Status is: Inpatient  Remains inpatient appropriate because:Ongoing diagnostic testing needed not appropriate for outpatient work up   Dispo: The patient is from: Home              Anticipated d/c is to: Home versus CIR              Anticipated d/c date is: 2 days              Patient currently is not medically stable to d/c.  Blood pressure medicines being adjusted.  PT/OT eval ongoing.  May  need CIR.  Infusions:    Scheduled Meds: . amLODipine  5 mg Oral Daily  . aspirin EC  81 mg Oral Daily  . atorvastatin  80 mg Oral Daily  . clopidogrel  75 mg Oral Daily  . enoxaparin (LOVENOX) injection  40 mg Subcutaneous Q24H  . glipiZIDE  5 mg Oral QAC breakfast  . insulin aspart  0-15 Units Subcutaneous TID WC  . insulin aspart  0-5 Units Subcutaneous QHS  . insulin glargine  15 Units Subcutaneous Daily  . insulin starter kit- pen needles  1 kit Other Once  . lisinopril  10 mg Oral Daily     Antimicrobials: Anti-infectives (From admission, onward)   None      PRN meds: acetaminophen **OR** acetaminophen (TYLENOL) oral liquid 160 mg/5 mL **OR** acetaminophen, hydrALAZINE   Objective: Vitals:   01/31/20 0831 01/31/20 1234  BP: (!) 171/99 (!) 167/114  Pulse: 91 77  Resp: 18 18  Temp: 98.3 F (36.8 C) 98 F (36.7 C)  SpO2: 99% 99%    Intake/Output Summary (Last 24 hours) at 01/31/2020 1351 Last data filed at 01/31/2020 0350 Gross per 24 hour  Intake --  Output 1200 ml  Net -1200 ml   Filed Weights   01/30/20 0033  Weight: 117.9 kg   Weight change:  Body mass index is 39.53 kg/m.   Physical Exam: General exam: Appears calm and comfortable.  Not in physical distress Skin: No rashes, lesions or ulcers. HEENT: Atraumatic, normocephalic, supple neck, no obvious bleeding Lungs: Clear to auscultation bilaterally. CVS: Regular rate and rhythm, no murmur GI/Abd soft, nontender, nondistended, bowel sound present CNS: Alert, awake, oriented x3, did not appreciate any weakness.  Reports numbness Psychiatry: Anxious, continues to have word finding difficulty Extremities: No pedal edema, no calf tenderness  Data Review: I have personally reviewed the laboratory data and studies available.  Recent Labs  Lab 01/30/20 0039 01/30/20 0048  WBC 13.4*  --   NEUTROABS 10.4*  --   HGB 17.5* 17.0  HCT 49.5 50.0  MCV 88.4  --   PLT 234  --    Recent Labs  Lab 01/30/20 0039 01/30/20 0048  NA 133* 135  K 4.0 3.9  CL 98 99  CO2 24  --   GLUCOSE 300* 301*  BUN 11 12  CREATININE 0.82 0.60*  CALCIUM 9.4  --    Lab Results  Component Value Date   HGBA1C 11.0 (H) 01/30/2020       Component Value Date/Time   CHOL 295 (H) 01/30/2020 0559   TRIG 164 (H) 01/30/2020 0559   HDL 65 01/30/2020 0559   CHOLHDL 4.5 01/30/2020 0559   VLDL 33 01/30/2020 0559   LDLCALC 197 (H) 01/30/2020 0559    Signed, Terrilee Croak, MD [facility] Pager: [phone]  (Secure Chat preferred). 01/31/2020

## 2020-01-31 NOTE — Progress Notes (Signed)
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  Permissive HTN of 220/120 per neuro.  PMH of DM and HTN.    PT Comments    Pt demonstrating good progress today.  However, pt does not have 24 hr support at home.  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  Pt continues to present with ataxia and dysmetria on R side.  He had difficulty with higher level balance activities including target tapping and picking items from floor.  From therapy perspective pt may have difficulty at home with IADLs, safety, and balance - would benefit from rehab at d/c.  Due to pt's high PLOF , pt with good rehab potential.  He reports his brother is out of town for a week and may be able to provide increased assistance/supervision after that timeframe.     Follow Up Recommendations  CIR     Equipment Recommendations  None recommended by PT    Recommendations for Other Services Rehab consult     Precautions / Restrictions Precautions Precautions: Fall    Mobility  Bed Mobility Overal bed mobility: Needs Assistance Bed Mobility: Supine to Sit;Sit to Supine     Supine to sit: Supervision Sit to supine: Supervision      Transfers Overall transfer level: Needs assistance Equipment used: None Transfers: Sit to/from Stand Sit to Stand: Min guard         General transfer comment: Min guard for safety; pt report mild dizziness (VSS)  Ambulation/Gait Ambulation/Gait assistance: Min guard Gait Distance (Feet): 100 Feet (100'x3 with standing rest breaks) Assistive device: None Gait Pattern/deviations: Decreased stride length;Ataxic Gait velocity: decreased   General Gait Details: Reports "feels like drunk."   Noted ataxia on R LE with hard heel  strike.  Cued to take time, focus on R LE placement, and for safety.  Pt with DOE of 3/4 , HR 130 bpm with walking, BPs were elevated but stable and within permissive HTN levels   Stairs Stairs: Yes Stairs assistance: Min assist Stair Management: Two rails;Alternating pattern Number of Stairs: 5     Wheelchair Mobility    Modified Rankin (Stroke Patients Only) Modified Rankin (Stroke Patients Only) Pre-Morbid Rankin Score: No symptoms Modified Rankin: Moderately severe disability     Balance Overall balance assessment: Needs assistance Sitting-balance support: No upper extremity supported Sitting balance-Leahy Scale: Good     Standing balance support: No upper extremity supported Standing balance-Leahy Scale: Good Standing balance comment: See high level balance below               High Level Balance Comments: Pt able to statically stand with feet apart EO and EC.  Pt had difficulty with standing feet together EC.  For dynamic task pt able to turn in circle steadily, but struggled with head turns, target tapping with R foot, and picking item from floor.            Cognition Arousal/Alertness: Awake/alert Behavior During Therapy: Impulsive Overall Cognitive Status: No family/caregiver present to determine baseline cognitive functioning Area of Impairment: Safety/judgement                         Safety/Judgement: Decreased awareness of safety     General Comments:  pt aware of deficits however remains impulsive      Exercises      General Comments General comments (skin integrity, edema, etc.):   dditionally performed target tapping with R foot sitting EOB - pt with dysmetria with this task.  Noted dysmetria/past pointing R UE and uncontrolled movements with R arm during gait.    Pt reports will not have 24 hr supervision at home.  Reports his brother is out of town for a week , but could provide more assistance next week (still not 24 hr).       Pertinent Vitals/Pain Pain Assessment: No/denies pain Pain Score: 0-No pain    Home Living     Available Help at Discharge: Family;Available PRN/intermittently Type of Home: House              Prior Function            PT Goals (current goals can now be found in the care plan section) Acute Rehab PT Goals Patient Stated Goal: get better, back to work, reports rehab would be good initially to help achieve getting back to work PT Goal Formulation: With patient Time For Goal Achievement: 02/13/20 Potential to Achieve Goals: Good Progress towards PT goals: Progressing toward goals    Frequency    Min 4X/week      PT Plan Discharge plan needs to be updated    Co-evaluation              AM-PAC PT "6 Clicks" Mobility   Outcome Measure  Help needed turning from your back to your side while in a flat bed without using bedrails?: None Help needed moving from lying on your back to sitting on the side of a flat bed without using bedrails?: None Help needed moving to and from a bed to a chair (including a wheelchair)?: A Little Help needed standing up from a chair using your arms (e.g., wheelchair or bedside chair)?: A Little Help needed to walk in hospital room?: A Little Help needed climbing 3-5 steps with a railing? : A Little 6 Click Score: 20    End of Session Equipment Utilized During Treatment: Gait belt Activity Tolerance: Patient tolerated treatment well Patient left: in bed;with call bell/phone within reach;with bed alarm set Nurse Communication: Mobility status PT Visit Diagnosis: Unsteadiness on feet (R26.81);Difficulty in walking, not elsewhere classified (R26.2)     Time: 2841-3244 PT Time Calculation (min) (ACUTE ONLY): 36 min  Charges:  $Gait Training: 8-22 mins $Neuromuscular Re-education: 8-22 mins                     Anise Salvo, PT Acute Rehab Services Pager [phone] [facility] [phone]     Rayetta Humphrey 01/31/2020,  1:58 PM

## 2020-01-31 NOTE — Evaluation (Signed)
Speech Language Pathology Evaluation Patient Details Name: Peter Tran MRN: [id number] DOB: 21-Jun-1971 Today's Date: 01/31/2020 Time: 5053-9767 SLP Time Calculation (min) (ACUTE ONLY): 16 min  Problem List:  Patient Active Problem List   Diagnosis Date Noted  . DM2 (diabetes mellitus, type 2) (HCC) 01/30/2020  . HTN (hypertension) 01/30/2020  . Wolff-Parkinson-White (WPW) pattern seen on electrocardiography 01/30/2020  . Acute ischemic stroke (HCC) 01/30/2020  . HEADACHE 03/31/2007   Past Medical History:  Past Medical History:  Diagnosis Date  . Diabetes mellitus without complication (HCC)   . Hypertension    Past Surgical History:  Past Surgical History:  Procedure Laterality Date  . HAND SURGERY Right    HPI:  Pt is a 49 y.o. male with medical history significant of DM2, HTN, smoking who presented to the ED with onset of R sided numbness and difficult with speech described as "difficulty getting words out". MRI brain: Small acute infarct of the anterolateral left thalamus. CTA negative for LVO.    Assessment / Plan / Recommendation Clinical Impression  Pt participated in speech/language/cognition evaluation. He reported that he is currently employed full time as a Production designer, theatre/television/film at [employer]. He denied any baseline impairments in speech, language or cognition but stated that he has never "been good at" numbers. He stated that he believes his language and cognitive skills are currently at baseline, but described his speech as "slurred" and likened it to an individual who is intoxicated. He initially stated that his speech is 95% back to baseline but subsequently indicated that it still "has a ways to go." [facility] Mental Status Examination was completed to evaluate the pt's cognitive-linguistic skills. He achieved a score of 22/30 which is below the normal limits of 27 or more out of 30 and is suggestive of a mild impairment. He exhibited difficulty in the areas of  attention and  executive function. Pt indicated that his performance was likely his baseline, but that he was so focused on responding quickly to show how well he was doing that he may not have listened fully to some of the questions. Pt also presented with mild dysarthria characterized by reduced articulatory precision which negatively impacted speech intelligibility at the conversational level. Skilled SLP services are clinically indicated at this time to improve motor speech skills, and cognitive-linguistic treatment will be initiated if it is subsequently determined that the pt's cognitive-linguistic skills are not at baseline.     SLP Assessment  SLP Recommendation/Assessment: Patient needs continued Speech Lanaguage Pathology Services SLP Visit Diagnosis: Dysarthria and anarthria (R47.1);Cognitive communication deficit (R41.841)    Follow Up Recommendations  Home health SLP    Frequency and Duration min 2x/week  2 weeks      SLP Evaluation Cognition  Overall Cognitive Status: No family/caregiver present to determine baseline cognitive functioning Arousal/Alertness: Awake/alert Orientation Level: Oriented X4 Attention: Sustained;Focused Focused Attention: Appears intact Sustained Attention: Impaired Sustained Attention Impairment: Verbal complex Memory: Appears intact Awareness: Appears intact Problem Solving: Impaired Problem Solving Impairment: Verbal complex Executive Function: Organizing;Sequencing Sequencing: Impaired Sequencing Impairment: Verbal complex (Clock drawing: 0/4) Organizing: Impaired Organizing Impairment: Verbal complex (Backward digit span: 0/3)       Comprehension  Auditory Comprehension Overall Auditory Comprehension: Appears within functional limits for tasks assessed Yes/No Questions: Within Functional Limits Commands: Within Functional Limits Conversation: Complex Visual Recognition/Discrimination Discrimination: Within Function Limits Reading  Comprehension Reading Status: Within funtional limits    Expression Expression Primary Mode of Expression: Verbal Verbal Expression Overall Verbal Expression:  Appears within functional limits for tasks assessed Initiation: No impairment Level of Generative/Spontaneous Verbalization: Conversation Repetition: No impairment Naming: No impairment Pragmatics: No impairment   Oral / Motor  Oral Motor/Sensory Function Overall Oral Motor/Sensory Function: Mild impairment Facial ROM: Within Functional Limits Facial Symmetry: Within Functional Limits Facial Strength: Within Functional Limits Facial Sensation: Within Functional Limits Lingual ROM: Within Functional Limits Lingual Symmetry: Within Functional Limits Lingual Strength: Reduced;Suspected CN XII (hypoglossal) dysfunction Motor Speech Overall Motor Speech: Impaired Respiration: Within functional limits Phonation: Normal Resonance: Within functional limits Articulation: Impaired Level of Impairment: Conversation Intelligibility: Intelligibility reduced Word: 75-100% accurate Phrase: 75-100% accurate Sentence: 75-100% accurate Conversation: 75-100% accurate Motor Planning: Witnin functional limits Motor Speech Errors: Aware;Consistent   Rumi Taras I. Vear Clock, MS, CCC-SLP Acute Rehabilitation Services Office number [phone] Pager [phone]                 Scheryl Marten 01/31/2020, 12:20 PM

## 2020-01-31 NOTE — Progress Notes (Signed)
Cardiology Progress Note  Patient ID: Peter Tran MRN: [id number] DOB: 04/15/1971 Date of Encounter: 01/31/2020  Primary Cardiologist: Minus Breeding, MD  Subjective   Chief Complaint: none.   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 No symptoms. Tele without arrhythmias.   ROS:  All other ROS reviewed and negative. Pertinent positives noted in the HPI.     Inpatient Medications  Scheduled Meds: . amLODipine  5 mg Oral Daily  . aspirin EC  81 mg Oral Daily  . atorvastatin  80 mg Oral Daily  . clopidogrel  75 mg Oral Daily  . enoxaparin (LOVENOX) injection  40 mg Subcutaneous Q24H  . glipiZIDE  5 mg Oral QAC breakfast  . insulin aspart  0-15 Units Subcutaneous TID WC  . insulin aspart  0-5 Units Subcutaneous QHS  . insulin glargine  15 Units Subcutaneous Daily  . insulin starter kit- pen needles  1 kit Other Once  . lisinopril  10 mg Oral Daily   Continuous Infusions:  PRN Meds: acetaminophen **OR** acetaminophen (TYLENOL) oral liquid 160 mg/5 mL **OR** acetaminophen, hydrALAZINE   Vital Signs   Vitals:   01/31/20 0350 01/31/20 0420 01/31/20 0422 01/31/20 0831  BP: (!) 187/103 (!) 176/112 (!) 175/109 (!) 171/99  Pulse: 80   91  Resp: 16   18  Temp: 98.1 F (36.7 C)   98.3 F (36.8 C)  TempSrc: Oral   Oral  SpO2: 98%   99%  Weight:      Height:        Intake/Output Summary (Last 24 hours) at 01/31/2020 1100 Last data filed at 01/31/2020 0350 Gross per 24 hour  Intake --  Output 1200 ml  Net -1200 ml   Last 3 Weights 01/30/2020  Weight (lbs) 260 lb  Weight (kg) 117.935 kg      Telemetry  Overnight telemetry shows NSR, which I personally reviewed.   ECG  The most recent ECG shows NSR 88 bpm, short PR with delta wave, which I personally reviewed.   Physical Exam   Vitals:   01/31/20 0350 01/31/20 0420 01/31/20 0422 01/31/20 0831  BP: (!) 187/103 (!) 176/112 (!) 175/109 (!) 171/99  Pulse: 80   91  Resp: 16   18  Temp: 98.1 F (36.7 C)   98.3  F (36.8 C)  TempSrc: Oral   Oral  SpO2: 98%   99%  Weight:      Height:         Intake/Output Summary (Last 24 hours) at 01/31/2020 1100 Last data filed at 01/31/2020 0350 Gross per 24 hour  Intake --  Output 1200 ml  Net -1200 ml    Last 3 Weights 01/30/2020  Weight (lbs) 260 lb  Weight (kg) 117.935 kg    Body mass index is 39.53 kg/m.  General: Well nourished, well developed, in no acute distress Head: Atraumatic, normal size  Eyes: PEERLA, EOMI  Neck: Supple, no JVD Endocrine: No thryomegaly Cardiac: Normal S1, S2; RRR; no murmurs, rubs, or gallops Lungs: Clear to auscultation bilaterally, no wheezing, rhonchi or rales  Abd: Soft, nontender, no hepatomegaly  Ext: No edema, pulses 2+ Musculoskeletal: No deformities, BUE and BLE strength normal and equal Skin: Warm and dry, no rashes   Neuro: Alert and oriented to person, place, time, and situation, CNII-XII grossly intact, no focal deficits  Psych: Normal mood and affect   Labs  High Sensitivity Troponin:  No results for input(s): TROPONINIHS in the last 720 hours.  Cardiac EnzymesNo results for input(s): TROPONINI in the last 168 hours. No results for input(s): TROPIPOC in the last 168 hours.  Chemistry Recent Labs  Lab 01/30/20 0039 01/30/20 0048  NA 133* 135  K 4.0 3.9  CL 98 99  CO2 24  --   GLUCOSE 300* 301*  BUN 11 12  CREATININE 0.82 0.60*  CALCIUM 9.4  --   PROT 7.4  --   ALBUMIN 3.9  --   AST 26  --   ALT 40  --   ALKPHOS 95  --   BILITOT 1.1  --   GFRNONAA >60  --   GFRAA >60  --   ANIONGAP 11  --     Hematology Recent Labs  Lab 01/30/20 0039 01/30/20 0048  WBC 13.4*  --   RBC 5.60  --   HGB 17.5* 17.0  HCT 49.5 50.0  MCV 88.4  --   MCH 31.3  --   MCHC 35.4  --   RDW 11.5  --   PLT 234  --    BNPNo results for input(s): BNP, PROBNP in the last 168 hours.  DDimer No results for input(s): DDIMER in the last 168 hours.   Radiology  CT ANGIO HEAD W OR WO CONTRAST  Result Date:  01/30/2020 CLINICAL DATA:  Neuro deficit, acute, stroke suspected EXAM: CT ANGIOGRAPHY HEAD AND NECK TECHNIQUE: Multidetector CT imaging of the head and neck was performed using the standard protocol during bolus administration of intravenous contrast. Multiplanar CT image reconstructions and MIPs were obtained to evaluate the vascular anatomy. Carotid stenosis measurements (when applicable) are obtained utilizing NASCET criteria, using the distal internal carotid diameter as the denominator. CONTRAST:  121m OMNIPAQUE IOHEXOL 350 MG/ML SOLN COMPARISON:  None. FINDINGS: CTA NECK FINDINGS SKELETON: There is no bony spinal canal stenosis. No lytic or blastic lesion. OTHER NECK: Normal pharynx, larynx and major salivary glands. No cervical lymphadenopathy. Unremarkable thyroid gland. UPPER CHEST: No pneumothorax or pleural effusion. No nodules or masses. AORTIC ARCH: There is no calcific atherosclerosis of the aortic arch. There is no aneurysm, dissection or hemodynamically significant stenosis of the visualized portion of the aorta. Conventional 3 vessel aortic branching pattern. The visualized proximal subclavian arteries are widely patent. RIGHT CAROTID SYSTEM: Normal without aneurysm, dissection or stenosis. LEFT CAROTID SYSTEM: Normal without aneurysm, dissection or stenosis. VERTEBRAL ARTERIES: Right dominant configuration. Both origins are clearly patent. There is no dissection, occlusion or flow-limiting stenosis to the skull base (V1-V3 segments). CTA HEAD FINDINGS POSTERIOR CIRCULATION: --Vertebral arteries: Atherosclerotic calcification narrows the left V4 segment. The right is normal. --Inferior cerebellar arteries: Normal. --Basilar artery: Normal. --Superior cerebellar arteries: Normal. --Posterior cerebral arteries (PCA): Normal. ANTERIOR CIRCULATION: --Intracranial internal carotid arteries: There is moderate stenosis of the lacerum segment of the left internal carotid artery secondary to noncalcified  plaque. Otherwise, there is bilateral atherosclerotic calcification with mild multifocal stenosis. --Anterior cerebral arteries (ACA): Normal. Both A1 segments are present. Patent anterior communicating artery (a-comm). --Middle cerebral arteries (MCA): Normal. VENOUS SINUSES: As permitted by contrast timing, patent. ANATOMIC VARIANTS: None Review of the MIP images confirms the above findings. IMPRESSION: 1. No emergent large vessel occlusion. 2. Moderate stenosis of the lacerum segment of the left internal carotid artery secondary to noncalcified plaque. 3. Mild stenosis of the left vertebral artery V4 segment. 4. Aortic Atherosclerosis (ICD10-I70.0). Electronically Signed   By: KUlyses JarredM.D.   On: 01/30/2020 02:13   DG Chest 2 View  Result Date: 01/30/2020 CLINICAL  DATA:  Right-sided numbness EXAM: CHEST - 2 VIEW COMPARISON:  None. FINDINGS: The heart size and mediastinal contours are within normal limits. Both lungs are clear. The visualized skeletal structures are unremarkable. IMPRESSION: No active cardiopulmonary disease. Electronically Signed   By: Ulyses Jarred M.D.   On: 01/30/2020 02:36   CT ANGIO NECK W OR WO CONTRAST  Result Date: 01/30/2020 CLINICAL DATA:  Neuro deficit, acute, stroke suspected EXAM: CT ANGIOGRAPHY HEAD AND NECK TECHNIQUE: Multidetector CT imaging of the head and neck was performed using the standard protocol during bolus administration of intravenous contrast. Multiplanar CT image reconstructions and MIPs were obtained to evaluate the vascular anatomy. Carotid stenosis measurements (when applicable) are obtained utilizing NASCET criteria, using the distal internal carotid diameter as the denominator. CONTRAST:  184m OMNIPAQUE IOHEXOL 350 MG/ML SOLN COMPARISON:  None. FINDINGS: CTA NECK FINDINGS SKELETON: There is no bony spinal canal stenosis. No lytic or blastic lesion. OTHER NECK: Normal pharynx, larynx and major salivary glands. No cervical lymphadenopathy. Unremarkable  thyroid gland. UPPER CHEST: No pneumothorax or pleural effusion. No nodules or masses. AORTIC ARCH: There is no calcific atherosclerosis of the aortic arch. There is no aneurysm, dissection or hemodynamically significant stenosis of the visualized portion of the aorta. Conventional 3 vessel aortic branching pattern. The visualized proximal subclavian arteries are widely patent. RIGHT CAROTID SYSTEM: Normal without aneurysm, dissection or stenosis. LEFT CAROTID SYSTEM: Normal without aneurysm, dissection or stenosis. VERTEBRAL ARTERIES: Right dominant configuration. Both origins are clearly patent. There is no dissection, occlusion or flow-limiting stenosis to the skull base (V1-V3 segments). CTA HEAD FINDINGS POSTERIOR CIRCULATION: --Vertebral arteries: Atherosclerotic calcification narrows the left V4 segment. The right is normal. --Inferior cerebellar arteries: Normal. --Basilar artery: Normal. --Superior cerebellar arteries: Normal. --Posterior cerebral arteries (PCA): Normal. ANTERIOR CIRCULATION: --Intracranial internal carotid arteries: There is moderate stenosis of the lacerum segment of the left internal carotid artery secondary to noncalcified plaque. Otherwise, there is bilateral atherosclerotic calcification with mild multifocal stenosis. --Anterior cerebral arteries (ACA): Normal. Both A1 segments are present. Patent anterior communicating artery (a-comm). --Middle cerebral arteries (MCA): Normal. VENOUS SINUSES: As permitted by contrast timing, patent. ANATOMIC VARIANTS: None Review of the MIP images confirms the above findings. IMPRESSION: 1. No emergent large vessel occlusion. 2. Moderate stenosis of the lacerum segment of the left internal carotid artery secondary to noncalcified plaque. 3. Mild stenosis of the left vertebral artery V4 segment. 4. Aortic Atherosclerosis (ICD10-I70.0). Electronically Signed   By: KUlyses JarredM.D.   On: 01/30/2020 02:13   MR BRAIN WO CONTRAST  Result Date:  01/30/2020 CLINICAL DATA:  Stroke follow-up EXAM: MRI HEAD WITHOUT CONTRAST TECHNIQUE: Multiplanar, multiecho pulse sequences of the brain and surrounding structures were obtained without intravenous contrast. COMPARISON:  Brain MRI 02/26/2005 Head CT 01/30/2020 FINDINGS: Brain: Small acute infarct of the anterolateral left thalamus. Normal white matter signal. Normal volume of CSF spaces. No chronic microhemorrhage. Normal midline structures. Vascular: Normal flow voids. Skull and upper cervical spine: Normal marrow signal. Sinuses/Orbits: Negative. Other: None. IMPRESSION: Small acute infarct of the anterolateral left thalamus. No acute hemorrhage or mass effect. Electronically Signed   By: KUlyses JarredM.D.   On: 01/30/2020 01:51   ECHOCARDIOGRAM COMPLETE  Result Date: 01/30/2020    ECHOCARDIOGRAM REPORT   Patient Name:   JNURI LARMERDate of Exam: 01/30/2020 Medical Rec #:  [id number]   Height:       68.0 in Accession #:    [id number]  Weight:  260.0 lb Date of Birth:  02-26-1971    BSA:          2.285 m Patient Age:    67 years     BP:           182/93 mmHg Patient Gender: M            HR:           79 bpm. Exam Location:  Inpatient Procedure: 2D Echo Indications:    stroke 434.91  History:        Patient has no prior history of Echocardiogram examinations.                 Risk Factors:Diabetes, Hypertension and Current Smoker.  Sonographer:    Jannett Celestine RDCS (AE) Referring Phys: [phone] JARED M GARDNER  Sonographer Comments: Patient is morbidly obese. see comments IMPRESSIONS  1. Left ventricular ejection fraction, by estimation, is 55 to 60%. The left ventricle has normal function. The left ventricle has no regional wall motion abnormalities. Left ventricular diastolic function could not be evaluated.  2. Right ventricular systolic function is normal. The right ventricular size is normal. Tricuspid regurgitation signal is inadequate for assessing PA pressure.  3. The mitral valve is degenerative. No  evidence of mitral valve regurgitation. No evidence of mitral stenosis.  4. The aortic valve is tricuspid. Aortic valve regurgitation is not visualized. No aortic stenosis is present.  5. The inferior vena cava is normal in size with greater than 50% respiratory variability, suggesting right atrial pressure of 3 mmHg. Conclusion(s)/Recommendation(s): No intracardiac source of embolism detected on this transthoracic study. A transesophageal echocardiogram is recommended to exclude cardiac source of embolism if clinically indicated. FINDINGS  Left Ventricle: Left ventricular ejection fraction, by estimation, is 55 to 60%. The left ventricle has normal function. The left ventricle has no regional wall motion abnormalities. The left ventricular internal cavity size was normal in size. There is  no left ventricular hypertrophy. Left ventricular diastolic function could not be evaluated due to mitral annular calcification (moderate or greater). Left ventricular diastolic function could not be evaluated. Right Ventricle: The right ventricular size is normal. No increase in right ventricular wall thickness. Right ventricular systolic function is normal. Tricuspid regurgitation signal is inadequate for assessing PA pressure. Left Atrium: Left atrial size was normal in size. Right Atrium: Right atrial size was normal in size. Pericardium: Trivial pericardial effusion is present. Mitral Valve: The mitral valve is degenerative in appearance. There is moderate calcification of the posterior mitral valve leaflet(s). Moderate mitral annular calcification. No evidence of mitral valve regurgitation. No evidence of mitral valve stenosis. Tricuspid Valve: The tricuspid valve is grossly normal. Tricuspid valve regurgitation is not demonstrated. No evidence of tricuspid stenosis. Aortic Valve: The aortic valve is tricuspid. Aortic valve regurgitation is not visualized. No aortic stenosis is present. Pulmonic Valve: The pulmonic valve  was grossly normal. Pulmonic valve regurgitation is not visualized. No evidence of pulmonic stenosis. Aorta: The aortic root is normal in size and structure. Venous: The inferior vena cava is normal in size with greater than 50% respiratory variability, suggesting right atrial pressure of 3 mmHg. IAS/Shunts: The atrial septum is grossly normal.  LEFT VENTRICLE PLAX 2D LVIDd:         4.40 cm  Diastology LVIDs:         3.00 cm  LV e' lateral:   8.59 cm/s LV PW:         1.00 cm  LV E/e'  lateral: 12.8 LV IVS:        1.00 cm  LV e' medial:    6.64 cm/s LVOT diam:     2.20 cm  LV E/e' medial:  16.6 LV SV:         77 LV SV Index:   34 LVOT Area:     3.80 cm  LEFT ATRIUM           Index       RIGHT ATRIUM           Index LA diam:      3.40 cm 1.49 cm/m  RA Area:     17.90 cm LA Vol (A4C): 51.7 ml 22.63 ml/m RA Volume:   47.30 ml  20.70 ml/m  AORTIC VALVE LVOT Vmax:   111.00 cm/s LVOT Vmean:  72.500 cm/s LVOT VTI:    0.203 m  AORTA Ao Root diam: 3.10 cm MITRAL VALVE MV Area (PHT): 2.91 cm     SHUNTS MV Decel Time: 261 msec     Systemic VTI:  0.20 m MV E velocity: 110.00 cm/s  Systemic Diam: 2.20 cm MV A velocity: 114.00 cm/s MV E/A ratio:  0.96 Eleonore Chiquito MD Electronically signed by Eleonore Chiquito MD Signature Date/Time: 01/30/2020/1:03:04 PM    Final    CT HEAD CODE STROKE WO CONTRAST  Result Date: 01/30/2020 CLINICAL DATA:  Code stroke. 49 year old male with right side numbness and difficulty speaking last seen normal 1500 hours. EXAM: CT HEAD WITHOUT CONTRAST TECHNIQUE: Contiguous axial images were obtained from the base of the skull through the vertex without intravenous contrast. COMPARISON:  Brain MRI 02/26/2005.  Head CT 02/11/2005. FINDINGS: Brain: Cerebral volume appears normal. No midline shift, ventriculomegaly, mass effect, evidence of mass lesion, intracranial hemorrhage or evidence of cortically based acute infarction. Subtle new hypodensity in the left lateral thalamus near the posterior limb of the  left internal capsule. Otherwise gray-white matter differentiation is within normal limits throughout the brain. Vascular: Calcified atherosclerosis at the skull base. No suspicious intracranial vascular hyperdensity. Skull: Chronic appearing periapical lucency at the posterior right maxilla, likely dental related. No acute osseous abnormality identified. Sinuses/Orbits: Paranasal sinuses, tympanic cavities and mastoids are clear. Other: Slight rightward gaze. Otherwise negative orbit and scalp soft tissues. ASPECTS [facility] Stroke Program Early CT Score) Total score (0-10 with 10 being normal): 10 IMPRESSION: 1. Acute lacunar infarct suspected at the lateral left thalamus. No associated hemorrhage or mass effect. 2. Elsewhere stable and negative noncontrast CT appearance of the brain. ASPECTS 10. 3. These results were communicated to Dr. Lorraine Lax at 12:23 am on 01/30/2020 by text page via the [facility] messaging system. Electronically Signed   By: Genevie Ann M.D.   On: 01/30/2020 00:24    Cardiac Studies   TTE 01/30/2020 1. Left ventricular ejection fraction, by estimation, is 55 to 60%. The  left ventricle has normal function. The left ventricle has no regional  wall motion abnormalities. Left ventricular diastolic function could not  be evaluated.  2. Right ventricular systolic function is normal. The right ventricular  size is normal. Tricuspid regurgitation signal is inadequate for assessing  PA pressure.  3. The mitral valve is degenerative. No evidence of mitral valve  regurgitation. No evidence of mitral stenosis.  4. The aortic valve is tricuspid. Aortic valve regurgitation is not  visualized. No aortic stenosis is present.  5. The inferior vena cava is normal in size with greater than 50%  respiratory variability, suggesting right atrial pressure of  3 mmHg.   Patient Profile  DALBERT STILLINGS is a 49 y.o. male with uncontrolled DM, HTN admitted with CVA 01/30/2020 and found to incidentally have  pre-excitation on EKG.   Assessment & Plan   1. WPW pattern, preexcitation - no arrhythmia on tele. No symptoms or palpitations.  - echo with normal EF and no congential defect - will arrange for 14 day zio patch at discharge and follow up with Dr. Percival Spanish - pattern conducts in the 90s but may consider ETT to see if it conducts at higher rates   2. 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   Medication Recommendations:  none Other recommendations (labs, testing, etc):  We will arrange outpatient zio patch Follow up as an outpatient:  With Dr. Percival Spanish  For questions or updates, please contact [place] Please consult [email or web address] for contact info under   Time Spent with Patient: I have spent a total of 25 minutes with patient reviewing hospital notes, telemetry, EKGs, labs and examining the patient as well as establishing an assessment and plan that was discussed with the patient.  > 50% of time was spent in direct patient care.    Signed, Addison Naegeli. Audie Box, [place]  01/31/2020 11:00 AM

## 2020-01-31 NOTE — Progress Notes (Signed)
Inpatient Diabetes Program Recommendations  AACE/ADA: New Consensus Statement on Inpatient Glycemic Control (2015)  Target Ranges:  Prepandial:   less than 140 mg/dL      Peak postprandial:   less than 180 mg/dL (1-2 hours)      Critically ill patients:  140 - 180 mg/dL   Lab Results  Component Value Date   GLUCAP 201 (H) 01/31/2020   HGBA1C 11.0 (H) 01/30/2020    Review of Glycemic Control  Diabetes history: DM 2 Outpatient Diabetes medications: None Current orders for Inpatient glycemic control:  Lantus 15 units Daily Novolog 0-15 units tid + hs Glipizide 5 mg Daily  Spoke to pt regarding A1c and glucose control at home. Discussed need for insulin. Discussed importance of glucose control. Pt works third shift as a Administrator, arts. Discussed hypoglycemia. Pt has been a runner in the past and is familiar with diet changes and exercise needed for glucose control.    Inpatient Diabetes Program Recommendations:    - Right sided numbness will assess ability to administer insulin pen     (pt able to operate insulin pen with a couple of modifications)  Covered by insurance:  Levemir flexpen order # [id number] Insulin pen needles order # [id number] Glucose meter kit order # [id number]  Thanks,  Tama Headings RN, MSN, BC-ADM Inpatient Diabetes Coordinator Team Pager [phone] (8a-5p)

## 2020-02-01 DIAGNOSIS — I456 Pre-excitation syndrome: Secondary | ICD-10-CM

## 2020-02-01 DIAGNOSIS — Z720 Tobacco use: Secondary | ICD-10-CM

## 2020-02-01 DIAGNOSIS — I10 Essential (primary) hypertension: Secondary | ICD-10-CM | POA: Diagnosis not present

## 2020-02-01 DIAGNOSIS — E1159 Type 2 diabetes mellitus with other circulatory complications: Secondary | ICD-10-CM | POA: Diagnosis not present

## 2020-02-01 DIAGNOSIS — I639 Cerebral infarction, unspecified: Secondary | ICD-10-CM

## 2020-02-01 LAB — GLUCOSE, CAPILLARY
Glucose-Capillary: 167 mg/dL — ABNORMAL HIGH (ref 70–99)
Glucose-Capillary: 136 mg/dL — ABNORMAL HIGH (ref 70–99)
Glucose-Capillary: 179 mg/dL — ABNORMAL HIGH (ref 70–99)
Glucose-Capillary: 162 mg/dL — ABNORMAL HIGH (ref 70–99)

## 2020-02-01 LAB — MAGNESIUM: Magnesium: 2 mg/dL (ref 1.7–2.4)

## 2020-02-01 LAB — POTASSIUM: Potassium: 4.4 mmol/L (ref 3.5–5.1)

## 2020-02-01 MED ORDER — METFORMIN HCL 500 MG PO TABS
500.0000 mg | ORAL_TABLET | Freq: Two times a day (BID) | ORAL | Status: DC
  Administered 2020-02-01 – 2020-02-03 (×6): 500 mg via ORAL
  Filled 2020-02-01 (×6): qty 1

## 2020-02-01 MED ORDER — AMLODIPINE BESYLATE 10 MG PO TABS
10.0000 mg | ORAL_TABLET | Freq: Every day | ORAL | Status: DC
  Administered 2020-02-01 – 2020-02-06 (×6): 10 mg via ORAL
  Filled 2020-02-01 (×6): qty 1

## 2020-02-01 NOTE — Progress Notes (Signed)
PROGRESS NOTE  Peter Tran  DOB: 11-18-1970  PCP: Patient, No Pcp Per FWY:[id number]  DOA: 01/30/2020  LOS: 2 days   No chief complaint on file.  Brief narrative: Peter Tran is a 49 y.o. male with PMH of colitis and hypertension who had not seen a physician for last 4 years and was not taking any medicine at home. Patient presented to the ED on 01/30/2020 with complaint of headache, right-sided numbness and word finding difficulties. EMS noted blood pressure to be 260/160. Brought to ED as a stroke alert. Stat CT head showed acute lacunar infarct at the lateral left thalamus. MRI confirmed CT finding. CT angio of head and neck did not show any emergent large vessel occlusion.  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 Seen by neurology. Admitted to hospital service for stroke work-up.  Subjective: Patient was seen and examined this morning.  Pleasant.  Not in distress. Continues to feel heavy on the right side. Waiting for bed availability/insurance authorization at [facility] Blood pressure in last 24 hours mostly between 140-150 systolic.  Fasting blood glucose this morning 167.  Assessment/Plan: Acute ischemic stroke-left lateral thalamus -Presented with right-sided numbness, headache and word finding difficulty. -CT and MRI finding as above showing acute infarct of lateral left thrombus -Stroke work-up started. -LDL 197, HDL 65, A1c 11 -echocardiogram with EF 55 to 60% and no intracardiac source of embolism. -Neurology consultation appreciated. -PT/OT/ST eval appreciated.  CIR recommended. -Started on dual antiplatelet therapy with aspirin 81 mg daily and Plavix 75 mg daily and Lipitor 80 mg daily.  Uncontrolled diabetes mellitus type 2 - A1c 11 -Currently on Lantus 15 units daily and glipizide 5 mg daily.  I also added him on Metformin 500 mg twice daily this morning. -Continue sliding scale insulin Accu-Cheks. -Diabetes coordinator consult  appreciated.  Hypertensive urgency -Not on blood pressure at home.  Presented with a blood pressure of 209/129 -Permissive hypertension allowed for first 24 hours. -Yesterday, I started him on lisinopril 10 mg daily and amlodipine 5 mg daily.  Blood pressure improving, 140s to 150s systolic in last 24 hours.  Increase amlodipine to 10 mg daily today.  Continue to monitor.  Dyslipidemia -LDL 197, HDL 65 -Started on Lipitor 80 mg daily.  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 -No h/o syncope or palpitations per patient. -Cardiology consult appreciated. -Echocardiogram with normal EF and no congenital defect. -Cardiology plans for Zio patch as an outpatient and follow-up with Dr. Antoine Poche.  Mobility: PT eval appreciated.  Code Status:   Code Status: Full Code  Nutritional status: Body mass index is 39.53 kg/m.     Diet Order            Diet Carb Modified Fluid consistency: Thin; Room service appropriate? Yes  Diet effective now                 DVT prophylaxis: enoxaparin (LOVENOX) injection 40 mg Start: 01/30/20 1000   Antimicrobials:  None Fluid: None  Consultants: Neurology, cardiology Family Communication:  None at bedside  Status is: Inpatient  Remains inpatient appropriate because:Ongoing diagnostic testing needed not appropriate for outpatient work up   Dispo: The patient is from: Home              Anticipated d/c is to: [facility]              Anticipated d/c date is: Anticipate medical stability by tomorrow  Patient currently is not medically stable to d/c.    Infusions:    Scheduled Meds: . amLODipine  10 mg Oral Daily  . aspirin EC  81 mg Oral Daily  . atorvastatin  80 mg Oral Daily  . clopidogrel  75 mg Oral Daily  . enoxaparin (LOVENOX) injection  40 mg Subcutaneous Q24H  . glipiZIDE  5 mg Oral QAC breakfast  . insulin aspart  0-15 Units Subcutaneous TID WC  . insulin aspart  0-5 Units Subcutaneous QHS  . insulin glargine  15  Units Subcutaneous Daily  . lisinopril  10 mg Oral Daily  . metFORMIN  500 mg Oral BID WC    Antimicrobials: Anti-infectives (From admission, onward)   None      PRN meds: acetaminophen **OR** acetaminophen (TYLENOL) oral liquid 160 mg/5 mL **OR** acetaminophen, hydrALAZINE   Objective: Vitals:   02/01/20 0735 02/01/20 1117  BP: (!) 147/90 136/80  Pulse: 72 88  Resp: 16 20  Temp: 98.3 F (36.8 C) 98.5 F (36.9 C)  SpO2: 98% 98%    Intake/Output Summary (Last 24 hours) at 02/01/2020 1331 Last data filed at 02/01/2020 0343 Gross per 24 hour  Intake --  Output 1625 ml  Net -1625 ml   Filed Weights   01/30/20 0033  Weight: 117.9 kg   Weight change:  Body mass index is 39.53 kg/m.   Physical Exam: General exam: Appears calm and comfortable.  Not in physical distress Skin: No rashes, lesions or ulcers. HEENT: Atraumatic, normocephalic, supple neck, no obvious bleeding Lungs: Clear to auscultation bilaterally CVS: Regular rate and rhythm, no murmur GI/Abd soft, nontender, nondistended, bowel sound present CNS: Alert, awake, oriented x3. Psychiatry: Anxious, continues to have word finding difficulty Extremities: No pedal edema, no calf tenderness  Data Review: I have personally reviewed the laboratory data and studies available.  Recent Labs  Lab 01/30/20 0039 01/30/20 0048  WBC 13.4*  --   NEUTROABS 10.4*  --   HGB 17.5* 17.0  HCT 49.5 50.0  MCV 88.4  --   PLT 234  --    Recent Labs  Lab 01/30/20 0039 01/30/20 0048 02/01/20 0313  NA 133* 135  --   K 4.0 3.9 4.4  CL 98 99  --   CO2 24  --   --   GLUCOSE 300* 301*  --   BUN 11 12  --   CREATININE 0.82 0.60*  --   CALCIUM 9.4  --   --   MG  --   --  2.0   Lab Results  Component Value Date   HGBA1C 11.0 (H) 01/30/2020       Component Value Date/Time   CHOL 295 (H) 01/30/2020 0559   TRIG 164 (H) 01/30/2020 0559   HDL 65 01/30/2020 0559   CHOLHDL 4.5 01/30/2020 0559   VLDL 33 01/30/2020 0559    LDLCALC 197 (H) 01/30/2020 0559    Signed, Lorin Glass, MD [facility] Pager: [phone] (Secure Chat preferred). 02/01/2020

## 2020-02-01 NOTE — Evaluation (Signed)
Occupational Therapy Evaluation Patient Details Name: Peter Tran MRN: [id number] DOB: 1970/12/11 Today's Date: 02/01/2020    History of Present Illness Pt is 49 yo male admitted with L thalamus CVA with R sided weakness and numbness.  Permissive HTN of 220/120 per neuro.  PMH of DM and HTN.   Clinical Impression   PTA patient independent and working. Admitted for above and limited by R dominant UE dysmetria and impaired sensation, impaired balance, decreased activity tolerance.  He currently requires min assist to min guard for in room mobility, min assist to mod assist for ADLs.  Mild dizziness with initial mobility in room, relies on reaching for UE support.  Mildly impulsive, aware of deficits but very fast moving; educated on pacing for safety and coordination.  He is highly motivated and believe he will best benefit from intensive CIR level rehab to optimize independence and return to PLOF with ADLs/mobility.  Will follow acutely.     Follow Up Recommendations  CIR    Equipment Recommendations  Other (comment) (TBD at next venue of care )    Recommendations for Other Services Rehab consult     Precautions / Restrictions Precautions Precautions: Fall Restrictions Weight Bearing Restrictions: No      Mobility Bed Mobility Overal bed mobility: Needs Assistance Bed Mobility: Supine to Sit;Sit to Supine     Supine to sit: Supervision Sit to supine: Supervision   General bed mobility comments: close supervision for safety   Transfers Overall transfer level: Needs assistance Equipment used: None Transfers: Sit to/from Stand Sit to Stand: Min assist         General transfer comment: min assist for inital balance but progressed to min guard, mild dizziness with inital mobility     Balance Overall balance assessment: Needs assistance Sitting-balance support: No upper extremity supported;Feet supported Sitting balance-Leahy Scale: Fair     Standing balance support:  No upper extremity supported;During functional activity Standing balance-Leahy Scale: Fair Standing balance comment: relies on UE support                            ADL either performed or assessed with clinical judgement   ADL Overall ADL's : Needs assistance/impaired     Grooming: Minimal assistance;Standing Grooming Details (indicate cue type and reason): difficutly managing grooming items with R hand, min guard to min assist dynamic balance Upper Body Bathing: Minimal assistance;Sitting   Lower Body Bathing: Moderate assistance;Sit to/from stand   Upper Body Dressing : Minimal assistance;Sitting   Lower Body Dressing: Moderate assistance;Sit to/from stand   Toilet Transfer: Minimal assistance;Ambulation Toilet Transfer Details (indicate cue type and reason): simulated in room          Functional mobility during ADLs: Minimal assistance;Cueing for safety;Cueing for sequencing General ADL Comments: unsteady and reports mild dizziness with inital mobility, limited by decreased coordination and sensation on R UE     Vision Baseline Vision/History: Wears glasses Wears Glasses: Reading only Patient Visual Report: No change from baseline Vision Assessment?: Yes Eye Alignment: Within Functional Limits Ocular Range of Motion: Within Functional Limits Alignment/Gaze Preference: Within Defined Limits Tracking/Visual Pursuits: Able to track stimulus in all quads without difficulty Visual Fields: No apparent deficits Additional Comments: appears [facility]     Perception     Praxis      Pertinent Vitals/Pain Pain Assessment: Faces Faces Pain Scale: Hurts a little bit Pain Location: R LE, R shoulder Pain Descriptors / Indicators: Sore Pain  Intervention(s): Monitored during session;Limited activity within patient's tolerance;Repositioned     Hand Dominance Right   Extremity/Trunk Assessment Upper Extremity Assessment Upper Extremity Assessment: RUE  deficits/detail RUE Deficits / Details: grossly 4+/5 MMT, dysmetric with poor sensation  RUE Sensation: decreased light touch (numb in finger tips, but sensation improving proximally) RUE Coordination: decreased fine motor;decreased gross motor   Lower Extremity Assessment Lower Extremity Assessment: Defer to PT evaluation   Cervical / Trunk Assessment Cervical / Trunk Assessment: Normal   Communication Communication Communication: Expressive difficulties   Cognition Arousal/Alertness: Awake/alert Behavior During Therapy: Impulsive Overall Cognitive Status: Impaired/Different from baseline Area of Impairment: Safety/judgement;Problem solving                         Safety/Judgement: Decreased awareness of safety   Problem Solving: Requires verbal cues;Difficulty sequencing;Slow processing General Comments: pt aware of deficits, but decreased safety awareness and requires cueing for problem solving    General Comments  patient educated on exercises for R UE coordination, use of bulit up foam handles; HR up to 126 during session    Exercises     Shoulder Instructions      Home Living Family/patient expects to be discharged to:: Private residence Living Arrangements: Alone Available Help at Discharge: Family;Available PRN/intermittently Type of Home: House Home Access: Level entry     Home Layout: One level     Bathroom Shower/Tub: Chief Strategy Officer: Standard     Home Equipment: None          Prior Functioning/Environment Level of Independence: Independent        Comments: works as a Production designer, theatre/television/film at [employer], driving         OT Problem List: Decreased activity tolerance;Impaired balance (sitting and/or standing);Decreased coordination;Decreased cognition;Decreased safety awareness;Decreased knowledge of use of DME or AE;Decreased knowledge of precautions;Impaired sensation;Impaired UE functional use;Cardiopulmonary status limiting  activity      OT Treatment/Interventions: Self-care/ADL training;Neuromuscular education;DME and/or AE instruction;Therapeutic activities;Cognitive remediation/compensation;Patient/family education;Balance training    OT Goals(Current goals can be found in the care plan section) Acute Rehab OT Goals Patient Stated Goal: to get to rehab, and eventually get back to work OT Goal Formulation: With patient Time For Goal Achievement: 02/15/20 Potential to Achieve Goals: Good  OT Frequency: Min 2X/week   Barriers to D/C:            Co-evaluation              AM-PAC OT "6 Clicks" Daily Activity     Outcome Measure Help from another person eating meals?: A Little Help from another person taking care of personal grooming?: A Little Help from another person toileting, which includes using toliet, bedpan, or urinal?: A Little Help from another person bathing (including washing, rinsing, drying)?: A Lot Help from another person to put on and taking off regular upper body clothing?: A Little Help from another person to put on and taking off regular lower body clothing?: A Lot 6 Click Score: 16   End of Session Equipment Utilized During Treatment: Gait belt Nurse Communication: Mobility status;Precautions  Activity Tolerance: Patient tolerated treatment well Patient left: in bed;with call bell/phone within reach;with bed alarm set  OT Visit Diagnosis: Other abnormalities of gait and mobility (R26.89);Muscle weakness (generalized) (M62.81);Other symptoms and signs involving the nervous system (R29.898);Other symptoms and signs involving cognitive function;Feeding difficulties (R63.3);Hemiplegia and hemiparesis Hemiplegia - Right/Left: Right Hemiplegia - dominant/non-dominant: Dominant Hemiplegia - caused by: Cerebral infarction  Time: 5631-4970 OT Time Calculation (min): 22 min Charges:  OT General Charges $OT Visit: 1 Visit OT Evaluation $OT Eval Moderate Complexity:  1 Mod  Barry Brunner, OT Acute Rehabilitation Services Pager [phone] Office [phone]   Chancy Milroy 02/01/2020, 10:19 AM

## 2020-02-01 NOTE — Progress Notes (Signed)
TOC consulted for medication assistance. No specific medication listed. Pt has insurance so unable to provide assistance with his medications other than co pay cards. TOC following.

## 2020-02-01 NOTE — Progress Notes (Signed)
Physical Therapy Treatment Patient Details Name: Peter Tran MRN: [id number] DOB: 08/27/1970 Today's Date: 02/01/2020    History of Present Illness Pt is 49 yo male admitted with L thalamus CVA with R sided weakness and numbness.  Permissive HTN of 220/120 per neuro.  PMH of DM and HTN.    PT Comments    Pt very motivated to participate with therapy and recover, almost to his detriment. Pt became tachycardic during ambulation with HR in the 140s, labored breathing, and c/o dizziness. Pt attempting to "push through", however was beginning to stagger and reach for railing for support. PTA strongly had to insist pt sit and was returned to room in [facility]. Once seated in recliner chair in room pt with c/o headache and need to lay down. HR 110 and BP 125/92. Rn notified of pt status.    Follow Up Recommendations  CIR     Equipment Recommendations  None recommended by PT    Recommendations for Other Services Rehab consult     Precautions / Restrictions Precautions Precautions: Fall Precaution Comments: R sided numbness    Mobility  Bed Mobility Overal bed mobility: Needs Assistance Bed Mobility: Supine to Sit;Sit to Supine     Supine to sit: Supervision Sit to supine: Supervision   General bed mobility comments: close supervision for safety   Transfers Overall transfer level: Needs assistance Equipment used: None Transfers: Sit to/from Stand Sit to Stand: Min guard         General transfer comment: min guard for safety. Able to rise from EOB and recliner chair w/o assist  Ambulation/Gait Ambulation/Gait assistance: Min guard Gait Distance (Feet): 250 Feet Assistive device: None Gait Pattern/deviations: Decreased stride length;Decreased step length - right Gait velocity: decreased   General Gait Details: Pt showing improved quality of gait initially. Over exaggerated arm swing and decreased step length on the R. Pt seems aware of his gait deficits and actively attempts  to correct. Pt became tachycardic during ambulation with HR in the 140s, labored breathing, and c/o dizziness. Pt attempting to "push through", however was beginning to stagger and reach for railing for support. PTA insisted pt sit and was returned to room in [facility].   Stairs             Wheelchair Mobility    Modified Rankin (Stroke Patients Only) Modified Rankin (Stroke Patients Only) Pre-Morbid Rankin Score: No symptoms Modified Rankin: Moderately severe disability     Balance Overall balance assessment: Needs assistance Sitting-balance support: No upper extremity supported;Feet supported Sitting balance-Leahy Scale: Fair     Standing balance support: No upper extremity supported;During functional activity Standing balance-Leahy Scale: Fair Standing balance comment: relies on UE support                             Cognition Arousal/Alertness: Awake/alert Behavior During Therapy: Impulsive Overall Cognitive Status: Impaired/Different from baseline Area of Impairment: Safety/judgement;Problem solving                         Safety/Judgement: Decreased awareness of safety   Problem Solving: Requires verbal cues;Difficulty sequencing;Slow processing General Comments: Pt lacking awareness of limitations. He likely would have pushed himself till he passed out had PTA not intervened and insisted he sit.      Exercises Other Exercises Other Exercises: sit<>stand 10x with LLE placed on 6in step to encourage use of RLE    General Comments General  comments (skin integrity, edema, etc.): HR in 140 with activity and 110 at rest      Pertinent Vitals/Pain Pain Assessment: Faces Faces Pain Scale: Hurts a little bit Pain Location: Head at end of session Pain Descriptors / Indicators: Sore Pain Intervention(s): Monitored during session;Limited activity within patient's tolerance;Repositioned    Home Living                      Prior Function             PT Goals (current goals can now be found in the care plan section) Acute Rehab PT Goals Patient Stated Goal: to get to rehab, and eventually get back to work PT Goal Formulation: With patient Time For Goal Achievement: 02/13/20 Potential to Achieve Goals: Good Progress towards PT goals: Progressing toward goals    Frequency    Min 4X/week      PT Plan Current plan remains appropriate    Co-evaluation              AM-PAC PT "6 Clicks" Mobility   Outcome Measure  Help needed turning from your back to your side while in a flat bed without using bedrails?: None Help needed moving from lying on your back to sitting on the side of a flat bed without using bedrails?: None Help needed moving to and from a bed to a chair (including a wheelchair)?: A Little Help needed standing up from a chair using your arms (e.g., wheelchair or bedside chair)?: A Little Help needed to walk in hospital room?: A Little Help needed climbing 3-5 steps with a railing? : A Little 6 Click Score: 20    End of Session Equipment Utilized During Treatment: Gait belt Activity Tolerance: Patient tolerated treatment well;Treatment limited secondary to medical complications (Comment) (tachycardic, dizziness, headache) Patient left: in bed;with call bell/phone within reach;with bed alarm set Nurse Communication: Mobility status PT Visit Diagnosis: Unsteadiness on feet (R26.81);Difficulty in walking, not elsewhere classified (R26.2)     Time: 7591-6384 PT Time Calculation (min) (ACUTE ONLY): 40 min  Charges:  $Gait Training: 23-37 mins $Neuromuscular Re-education: 8-22 mins                    Kallie Locks, [place] Pager [phone] Acute Rehab  [name] 02/01/2020, 2:23 PM

## 2020-02-01 NOTE — Progress Notes (Signed)
Telemetry called to report pt having 5 beats run of V-tach on the monitor. Pt asymptomatic and sleeping. MD on called paged and notified. New orders received, will continue to closely monitor. Dionne Bucy RN

## 2020-02-02 DIAGNOSIS — I10 Essential (primary) hypertension: Secondary | ICD-10-CM | POA: Diagnosis not present

## 2020-02-02 LAB — GLUCOSE, CAPILLARY
Glucose-Capillary: 155 mg/dL — ABNORMAL HIGH (ref 70–99)
Glucose-Capillary: 137 mg/dL — ABNORMAL HIGH (ref 70–99)
Glucose-Capillary: 143 mg/dL — ABNORMAL HIGH (ref 70–99)
Glucose-Capillary: 184 mg/dL — ABNORMAL HIGH (ref 70–99)

## 2020-02-02 LAB — CBC WITH DIFFERENTIAL/PLATELET
Abs Immature Granulocytes: 0.04 10*3/uL (ref 0.00–0.07)
Basophils Absolute: 0.1 10*3/uL (ref 0.0–0.1)
Basophils Relative: 1 %
Eosinophils Absolute: 0.2 10*3/uL (ref 0.0–0.5)
Eosinophils Relative: 2 %
HCT: 46.1 % (ref 39.0–52.0)
Hemoglobin: 16.1 g/dL (ref 13.0–17.0)
Immature Granulocytes: 0 %
Lymphocytes Relative: 25 %
Lymphs Abs: 2.4 10*3/uL (ref 0.7–4.0)
MCH: 31.3 pg (ref 26.0–34.0)
MCHC: 34.9 g/dL (ref 30.0–36.0)
MCV: 89.5 fL (ref 80.0–100.0)
Monocytes Absolute: 0.9 10*3/uL (ref 0.1–1.0)
Monocytes Relative: 9 %
Neutro Abs: 6.2 10*3/uL (ref 1.7–7.7)
Neutrophils Relative %: 63 %
Platelets: 216 10*3/uL (ref 150–400)
RBC: 5.15 MIL/uL (ref 4.22–5.81)
RDW: 11.7 % (ref 11.5–15.5)
WBC: 9.7 10*3/uL (ref 4.0–10.5)
nRBC: 0 % (ref 0.0–0.2)

## 2020-02-02 LAB — BASIC METABOLIC PANEL
Anion gap: 10 (ref 5–15)
BUN: 15 mg/dL (ref 6–20)
CO2: 22 mmol/L (ref 22–32)
Calcium: 9.2 mg/dL (ref 8.9–10.3)
Chloride: 103 mmol/L (ref 98–111)
Creatinine, Ser: 0.81 mg/dL (ref 0.61–1.24)
GFR calc Af Amer: >60 mL/min (ref >60)
GFR calc non Af Amer: >60 mL/min (ref >60)
Glucose, Bld: 187 mg/dL — ABNORMAL HIGH (ref 70–99)
Potassium: 4.2 mmol/L (ref 3.5–5.1)
Sodium: 135 mmol/L (ref 135–145)

## 2020-02-02 MED ORDER — INSULIN GLARGINE 100 UNIT/ML ~~LOC~~ SOLN
20.0000 [IU] | Freq: Every day | SUBCUTANEOUS | Status: DC
  Administered 2020-02-02: 20 [IU] via SUBCUTANEOUS
  Filled 2020-02-02 (×2): qty 0.2

## 2020-02-02 NOTE — Progress Notes (Addendum)
PROGRESS NOTE  Peter Tran  DOB: 1971-05-06  PCP: Patient, No Pcp Per IRC:[id number]  DOA: 01/30/2020  LOS: 3 days   No chief complaint on file.  Brief narrative: Peter Tran is a 49 y.o. male with PMH of colitis and hypertension who had not seen a physician for last 4 years and was not taking any medicine at home. Patient presented to the ED on 01/30/2020 with complaint of headache, right-sided numbness and word finding difficulties. EMS noted blood pressure to be 260/160. Brought to ED as a stroke alert. Stat CT head showed acute lacunar infarct at the lateral left thalamus. MRI confirmed CT finding. CT angio of head and neck did not show any emergent large vessel occlusion.  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 Seen by neurology. Admitted to hospital service for stroke work-up.  Subjective: Patient was seen and examined this morning.   Lying on bed.  Not in distress.  Feels better.  Symptoms gradually resolving.   Blood pressure and blood sugar level improving. Patient is very appreciative for an 'excellent' care he has received here.   Assessment/Plan: Acute ischemic stroke-left lateral thalamus -Presented with right-sided numbness, headache and word finding difficulty. -CT and MRI finding as above showing acute infarct of lateral left thrombus -Stroke work-up started. -LDL 197, HDL 65, A1c 11 -echocardiogram with EF 55 to 60% and no intracardiac source of embolism. -Neurology consultation appreciated. -PT/OT/ST eval appreciated.  CIR recommended.  Pending insurance authorization. -Started on dual antiplatelet therapy with aspirin 81 mg daily and Plavix 75 mg daily and Lipitor 80 mg daily.  Uncontrolled diabetes mellitus type 2 with hyperglycemia- A1c 11 -Currently on Lantus 15 units daily, glipizide 5 mg daily and Metformin 500 mg twice daily. -Blood sugar levels still remains elevated.  To reduce pill burden, I would increase Lantus dose to 20  units daily and stop glipizide. -Continue sliding scale insulin Accu-Cheks. -Diabetes coordinator consult appreciated.  Hypertensive urgency -Not on blood pressure at home.  Presented with a blood pressure of 209/129 -Permissive hypertension allowed for first 24 hours. -Currently he is on lisinopril 10 mg daily and amlodipine 10 mg daily.  Blood pressure mostly normal range in last 24 hours. -Continue to monitor.  Dyslipidemia -LDL 197, HDL 65 -Started on Lipitor 80 mg daily.  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 -No h/o syncope or palpitations per patient. -Cardiology consult appreciated. -Echocardiogram with normal EF and no congenital defect. -Cardiology plans for Zio patch as an outpatient and follow-up with Dr. Antoine Poche.  Mobility: PT eval appreciated.  Code Status:   Code Status: Full Code  Nutritional status: Body mass index is 39.53 kg/m.     Diet Order            Diet Carb Modified Fluid consistency: Thin; Room service appropriate? Yes  Diet effective now                 DVT prophylaxis: enoxaparin (LOVENOX) injection 40 mg Start: 01/30/20 1000   Antimicrobials:  None Fluid: None  Consultants: Neurology, cardiology Family Communication:  None at bedside  Status is: Inpatient  Remains inpatient appropriate because:Ongoing diagnostic testing needed not appropriate for outpatient work up   Dispo: The patient is from: Home              Anticipated d/c is to: CIR              Anticipated d/c date is: Medically stable for  discharge, pending insurance authorization               Infusions:    Scheduled Meds: . amLODipine  10 mg Oral Daily  . aspirin EC  81 mg Oral Daily  . atorvastatin  80 mg Oral Daily  . clopidogrel  75 mg Oral Daily  . enoxaparin (LOVENOX) injection  40 mg Subcutaneous Q24H  . insulin aspart  0-15 Units Subcutaneous TID WC  . insulin aspart  0-5 Units Subcutaneous QHS  . insulin glargine  20 Units Subcutaneous Daily   . lisinopril  10 mg Oral Daily  . metFORMIN  500 mg Oral BID WC    Antimicrobials: Anti-infectives (From admission, onward)   None      PRN meds: acetaminophen **OR** acetaminophen (TYLENOL) oral liquid 160 mg/5 mL **OR** acetaminophen, hydrALAZINE   Objective: Vitals:   02/02/20 0802 02/02/20 1138  BP: (!) 155/94 (!) 154/91  Pulse: 82 87  Resp: 18 18  Temp: 98.9 F (37.2 C) 98.7 F (37.1 C)  SpO2: 97% 99%    Intake/Output Summary (Last 24 hours) at 02/02/2020 1158 Last data filed at 02/02/2020 0800 Gross per 24 hour  Intake 240 ml  Output 1000 ml  Net -760 ml   Filed Weights   01/30/20 0033  Weight: 117.9 kg   Weight change:  Body mass index is 39.53 kg/m.   Physical Exam: General exam: Appears calm and comfortable.  Not in physical distress Skin: No rashes, lesions or ulcers. HEENT: Atraumatic, normocephalic, supple neck, no obvious bleeding Lungs: Clear to auscultation bilaterally CVS: Regular rate and rhythm, no murmur GI/Abd soft, nontender, nondistended, bowel sound present CNS: Alert, awake, oriented x3.  Psychiatry: Mood appropriate Extremities: No pedal edema, no calf tenderness  Data Review: I have personally reviewed the laboratory data and studies available.  Recent Labs  Lab 01/30/20 0039 01/30/20 0048 02/02/20 0155  WBC 13.4*  --  9.7  NEUTROABS 10.4*  --  6.2  HGB 17.5* 17.0 16.1  HCT 49.5 50.0 46.1  MCV 88.4  --  89.5  PLT 234  --  216   Recent Labs  Lab 01/30/20 0039 01/30/20 0048 02/01/20 0313 02/02/20 0155  NA 133* 135  --  135  K 4.0 3.9 4.4 4.2  CL 98 99  --  103  CO2 24  --   --  22  GLUCOSE 300* 301*  --  187*  BUN 11 12  --  15  CREATININE 0.82 0.60*  --  0.81  CALCIUM 9.4  --   --  9.2  MG  --   --  2.0  --    Lab Results  Component Value Date   HGBA1C 11.0 (H) 01/30/2020       Component Value Date/Time   CHOL 295 (H) 01/30/2020 0559   TRIG 164 (H) 01/30/2020 0559   HDL 65 01/30/2020 0559   CHOLHDL 4.5  01/30/2020 0559   VLDL 33 01/30/2020 0559   LDLCALC 197 (H) 01/30/2020 0559    Signed, Lorin Glass, MD [facility] Pager: [phone] (Secure Chat preferred). 02/02/2020

## 2020-02-02 NOTE — Progress Notes (Signed)
Inpatient Rehab Admissions:  Inpatient Rehab Consult received.  I met with patient at the bedside for rehabilitation assessment and to discuss goals and expectations of an inpatient rehab admission.  He is very interested in CIR program to return to mod I/indep prior to returning home alone.  Has a brother who lives in [place] who would be intermittently available.  Per PMR Consult on 8/11, pt with mod I goals.  Will open insurance for prior auth.   Signed: Shann Medal, PT, DPT Admissions Coordinator [phone] 02/02/20  12:29 PM

## 2020-02-02 NOTE — Progress Notes (Signed)
Physical Therapy Treatment Patient Details Name: Peter Tran MRN: [id number] DOB: 1970/12/30 Today's Date: 02/02/2020    History of Present Illness Pt is 49 yo male admitted with L thalamus CVA with R sided weakness and numbness.  Permissive HTN of 220/120 per neuro.  PMH of DM and HTN.    PT Comments    Patient progressing with mobility and noted improved activity tolerance with HR up to 127 max.  Also noting improvement in sequencing and timing of gait with cues and facilitation.  Patient continues to demonstrate improvements and agree with CIR level rehab to achieve mod I prior to home.    Follow Up Recommendations  CIR     Equipment Recommendations  None recommended by PT    Recommendations for Other Services       Precautions / Restrictions Precautions Precautions: Fall Precaution Comments: R sided numbness    Mobility  Bed Mobility Overal bed mobility: Needs Assistance Bed Mobility: Supine to Sit     Supine to sit: Supervision Sit to supine: Supervision      Transfers Overall transfer level: Needs assistance Equipment used: None   Sit to Stand: Min guard;Supervision         General transfer comment: Assist for safety  Ambulation/Gait Ambulation/Gait assistance: Supervision;Min guard Gait Distance (Feet): 250 Feet Assistive device: None Gait Pattern/deviations: Decreased stride length;Decreased step length - right     General Gait Details: robotic swing on R arm so encourged letting it rest and using trunk rotation with facilitation for more natrual armswing   Stairs Stairs: Yes Stairs assistance: Min assist Stair Management: Two rails;Alternating pattern;Forwards Number of Stairs: 6 General stair comments: no LOB or catching foot on steps of different heights, minguard for safety   Wheelchair Mobility    Modified Rankin (Stroke Patients Only) Modified Rankin (Stroke Patients Only) Pre-Morbid Rankin Score: No symptoms Modified Rankin:  Moderately severe disability     Balance Overall balance assessment: Needs assistance Sitting-balance support: No upper extremity supported;Feet supported Sitting balance-Leahy Scale: Good     Standing balance support: No upper extremity supported Standing balance-Leahy Scale: Good Standing balance comment: standing balance activities at rail in hallway, forward march, side stepping, crossing over, tandem gait and step taps to 6" step alternating veet all minguard and intermittent use of rail                            Cognition Arousal/Alertness: Awake/alert Behavior During Therapy: Impulsive Overall Cognitive Status: Impaired/Different from baseline Area of Impairment: Safety/judgement;Problem solving                         Safety/Judgement: Decreased awareness of safety   Problem Solving: Slow processing;Requires verbal cues        Exercises Other Exercises Other Exercises: supine R LE coordination hip and knee flexion/ext x 10    General Comments General comments (skin integrity, edema, etc.): HR max 127      Pertinent Vitals/Pain Pain Assessment: No/denies pain    Home Living                      Prior Function            PT Goals (current goals can now be found in the care plan section) Progress towards PT goals: Progressing toward goals    Frequency    Min 4X/week      PT Plan  Current plan remains appropriate    Co-evaluation              AM-PAC PT "6 Clicks" Mobility   Outcome Measure  Help needed turning from your back to your side while in a flat bed without using bedrails?: None Help needed moving from lying on your back to sitting on the side of a flat bed without using bedrails?: None Help needed moving to and from a bed to a chair (including a wheelchair)?: A Little Help needed standing up from a chair using your arms (e.g., wheelchair or bedside chair)?: A Little Help needed to walk in hospital  room?: A Little Help needed climbing 3-5 steps with a railing? : A Little 6 Click Score: 20    End of Session   Activity Tolerance: Patient tolerated treatment well Patient left: in bed;with call bell/phone within reach Nurse Communication: Mobility status PT Visit Diagnosis: Other symptoms and signs involving the nervous system (R29.898);Muscle weakness (generalized) (M62.81)     Time: 3016-0109 PT Time Calculation (min) (ACUTE ONLY): 28 min  Charges:  $Gait Training: 8-22 mins $Neuromuscular Re-education: 8-22 mins                     Sheran Lawless, PT Acute Rehabilitation Services [phone] [phone] 02/02/2020    Elray Mcgregor 02/02/2020, 4:32 PM

## 2020-02-03 DIAGNOSIS — I10 Essential (primary) hypertension: Secondary | ICD-10-CM | POA: Diagnosis not present

## 2020-02-03 LAB — GLUCOSE, CAPILLARY
Glucose-Capillary: 146 mg/dL — ABNORMAL HIGH (ref 70–99)
Glucose-Capillary: 118 mg/dL — ABNORMAL HIGH (ref 70–99)
Glucose-Capillary: 142 mg/dL — ABNORMAL HIGH (ref 70–99)
Glucose-Capillary: 157 mg/dL — ABNORMAL HIGH (ref 70–99)

## 2020-02-03 MED ORDER — INSULIN GLARGINE 100 UNIT/ML ~~LOC~~ SOLN
25.0000 [IU] | Freq: Every day | SUBCUTANEOUS | Status: DC
  Administered 2020-02-03 – 2020-02-06 (×4): 25 [IU] via SUBCUTANEOUS
  Filled 2020-02-03 (×5): qty 0.25

## 2020-02-03 NOTE — Progress Notes (Signed)
Physical Therapy Treatment Patient Details Name: Peter Tran MRN: [id number] DOB: 11-24-1970 Today's Date: 02/03/2020    History of Present Illness Pt is 49 yo male admitted with L thalamus CVA with R sided weakness and numbness.  Permissive HTN of 220/120 per neuro.  PMH of DM and HTN.    PT Comments    Patient demonstrating high fall risk based on formal balance assessment (DGI 10/24).  He had difficulty particularly with obstacle negotiation and with vertical head turns slowing significantly while head turned up.  Working on gait with cane to prep for uneven surfaces pt demonstrating difficulty with coordinating task and with increased R UE tone.  He continues to benefit from skilled PT in the acute setting and given today's results remains appropriate for CIR level rehab to work to achieve mod I and to reduce risk of falls.   Follow Up Recommendations  CIR     Equipment Recommendations  None recommended by PT    Recommendations for Other Services       Precautions / Restrictions Precautions Precautions: Fall Precaution Comments: R sided numbness Restrictions Weight Bearing Restrictions: No    Mobility  Bed Mobility Overal bed mobility: Modified Independent                Transfers Overall transfer level: Needs assistance Equipment used: None Transfers: Sit to/from Stand Sit to Stand: Supervision         General transfer comment: assist for safety with room set up, pt reaching over b/s table to retrieve his mask with risk of falling  Ambulation/Gait Ambulation/Gait assistance: Supervision;Min guard;Min assist Gait Distance (Feet): 200 Feet (x 2) Assistive device: None;Straight cane Gait Pattern/deviations: Step-to pattern;Step-through pattern;Decreased stride length;Wide base of support     General Gait Details: completed DGI, worked on obstacle training and then practiced with SPC in prep for uneven surfaces; needed min A and mod cues with cane due to  increased R UE tone with novel task and decreased coordination with cane   Stairs Stairs: Yes Stairs assistance: Min guard Stair Management: Two rails;Alternating pattern;Forwards Number of Stairs: 4     Wheelchair Mobility    Modified Rankin (Stroke Patients Only) Modified Rankin (Stroke Patients Only) Pre-Morbid Rankin Score: No symptoms Modified Rankin: Moderately severe disability     Balance Overall balance assessment: Needs assistance Sitting-balance support: Feet supported Sitting balance-Leahy Scale: Good     Standing balance support: No upper extremity supported Standing balance-Leahy Scale: Good                 High Level Balance Comments: obstacle training stepping up onto step, then over cane on the floor and around boxes with intermittent rail use and intermittent min A Standardized Balance Assessment Standardized Balance Assessment : Dynamic Gait Index   Dynamic Gait Index Level Surface: Mild Impairment Change in Gait Speed: Moderate Impairment Gait with Horizontal Head Turns: Mild Impairment Gait with Vertical Head Turns: Moderate Impairment Gait and Pivot Turn: Mild Impairment Step Over Obstacle: Severe Impairment Step Around Obstacles: Severe Impairment (ran into cone passing on the L side) Steps: Mild Impairment Total Score: 10      Cognition Arousal/Alertness: Awake/alert Behavior During Therapy: WFL for tasks assessed/performed Overall Cognitive Status: Within Functional Limits for tasks assessed                                        Exercises  General Comments        Pertinent Vitals/Pain Pain Assessment: Faces Faces Pain Scale: Hurts little more Pain Location: R foot Pain Descriptors / Indicators: Tingling Pain Intervention(s): Monitored during session    Home Living                      Prior Function            PT Goals (current goals can now be found in the care plan section)  Progress towards PT goals: Progressing toward goals    Frequency    Min 4X/week      PT Plan Current plan remains appropriate    Co-evaluation              AM-PAC PT "6 Clicks" Mobility   Outcome Measure  Help needed turning from your back to your side while in a flat bed without using bedrails?: None Help needed moving from lying on your back to sitting on the side of a flat bed without using bedrails?: None Help needed moving to and from a bed to a chair (including a wheelchair)?: A Little Help needed standing up from a chair using your arms (e.g., wheelchair or bedside chair)?: A Little Help needed to walk in hospital room?: A Little Help needed climbing 3-5 steps with a railing? : A Little 6 Click Score: 20    End of Session   Activity Tolerance: Patient tolerated treatment well Patient left: in bed;with call bell/phone within reach   PT Visit Diagnosis: Other symptoms and signs involving the nervous system (R29.898);Other abnormalities of gait and mobility (R26.89);Muscle weakness (generalized) (M62.81)     Time: 3437-3578 PT Time Calculation (min) (ACUTE ONLY): 26 min  Charges:  $Gait Training: 8-22 mins $Therapeutic Activity: 8-22 mins                     Sheran Lawless, PT Acute Rehabilitation Services Pager:[phone] Office:[phone] 02/03/2020    Elray Mcgregor 02/03/2020, 6:06 PM

## 2020-02-03 NOTE — Progress Notes (Signed)
PROGRESS NOTE  Peter Tran  DOB: [birth year]  PCP: Patient, No Pcp Per YOV:[id number]  DOA: 01/30/2020  LOS: 4 days   No chief complaint on file.  Brief narrative: Peter Tran is a 49 y.o. male with PMH of colitis and hypertension who had not seen a physician for last 4 years and was not taking any medicine at home. Patient presented to the ED on 01/30/2020 with complaint of headache, right-sided numbness and word finding difficulties. EMS noted blood pressure to be 260/160. Brought to ED as a stroke alert. Stat CT head showed acute lacunar infarct at the lateral left thalamus. MRI confirmed CT finding. CT angio of head and neck did not show any emergent large vessel occlusion.  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 Seen by neurology. Admitted to hospital service for stroke work-up.  Subjective: Patient was seen and examined this afternoon Not in distress.  Pending insurance authorization for CIR transfer. Blood pressure trending up to 140s and 150s today. Blood sugar level well controlled.  Assessment/Plan: Acute ischemic stroke-left lateral thalamus -Presented with right-sided numbness, headache and word finding difficulty. -CT and MRI finding as above showing acute infarct of lateral left thrombus -Stroke work-up started. -LDL 197, HDL 65, A1c 11 -echocardiogram with EF 55 to 60% and no intracardiac source of embolism. -Neurology consultation appreciated. -PT/OT/ST eval appreciated.  CIR recommended.  Pending insurance authorization. -Started on dual antiplatelet therapy with aspirin 81 mg daily and Plavix 75 mg daily and Lipitor 80 mg daily.  Uncontrolled diabetes mellitus type 2 with hyperglycemia- A1c 11 -Currently on Lantus 20 units daily and Metformin 500 mg twice daily. -Blood sugar level coming under control. -Continue sliding scale insulin Accu-Cheks. -Diabetes coordinator consult appreciated.  Hypertensive urgency -Not on blood pressure  at home.  Presented with a blood pressure of 209/129 -Permissive hypertension allowed for first 24 hours. -Currently he is on lisinopril 10 mg daily and amlodipine 10 mg daily.   -Blood pressure 140s to 150s in last 24 hours.  Continue to monitor.  May need further adjustment.    Dyslipidemia -LDL 197, HDL 65 -Started on Lipitor 80 mg daily.  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 -No h/o syncope or palpitations per patient. -Cardiology consult appreciated. -Echocardiogram with normal EF and no congenital defect. -Cardiology plans for Zio patch as an outpatient and follow-up with Dr. Antoine Poche.  Mobility: PT eval appreciated.  Code Status:   Code Status: Full Code  Nutritional status: Body mass index is 39.53 kg/m.     Diet Order            Diet Carb Modified Fluid consistency: Thin; Room service appropriate? Yes  Diet effective now                 DVT prophylaxis: enoxaparin (LOVENOX) injection 40 mg Start: 01/30/20 1000   Antimicrobials:  None Fluid: None  Consultants: Neurology, cardiology Family Communication:  None at bedside  Status is: Inpatient  Remains inpatient appropriate because:Ongoing diagnostic testing needed not appropriate for outpatient work up   Dispo: The patient is from: Home              Anticipated d/c is to: CIR              Anticipated d/c date is: Medically stable for discharge, pending insurance authorization               Infusions:    Scheduled Meds: . amLODipine  10 mg Oral Daily  . aspirin EC  81 mg Oral Daily  . atorvastatin  80 mg Oral Daily  . clopidogrel  75 mg Oral Daily  . enoxaparin (LOVENOX) injection  40 mg Subcutaneous Q24H  . insulin aspart  0-15 Units Subcutaneous TID WC  . insulin aspart  0-5 Units Subcutaneous QHS  . insulin glargine  25 Units Subcutaneous Daily  . lisinopril  10 mg Oral Daily  . metFORMIN  500 mg Oral BID WC    Antimicrobials: Anti-infectives (From admission, onward)   None       PRN meds: acetaminophen **OR** acetaminophen (TYLENOL) oral liquid 160 mg/5 mL **OR** acetaminophen, hydrALAZINE   Objective: Vitals:   02/03/20 1218 02/03/20 1653  BP: (!) 152/96 (!) 156/86  Pulse: 86 72  Resp: 18 18  Temp: 98.6 F (37 C) 98.5 F (36.9 C)  SpO2: 100% 98%    Intake/Output Summary (Last 24 hours) at 02/03/2020 1728 Last data filed at 02/03/2020 1200 Gross per 24 hour  Intake 480 ml  Output --  Net 480 ml   Filed Weights   01/30/20 0033  Weight: 117.9 kg   Weight change:  Body mass index is 39.53 kg/m.   Physical Exam: General exam: Appears calm and comfortable.  Not in physical distress Skin: No rashes, lesions or ulcers. HEENT: Atraumatic, normocephalic, supple neck, no obvious bleeding Lungs: Clear to auscultate bilaterally CVS: Regular rate and rhythm, no murmur GI/Abd soft, nontender, nondistended, bowel sound present CNS: Alert, awake, oriented x3.  Psychiatry: Mood appropriate Extremities: No pedal edema, no calf tenderness  Data Review: I have personally reviewed the laboratory data and studies available.  Recent Labs  Lab 01/30/20 0039 01/30/20 0048 02/02/20 0155  WBC 13.4*  --  9.7  NEUTROABS 10.4*  --  6.2  HGB 17.5* 17.0 16.1  HCT 49.5 50.0 46.1  MCV 88.4  --  89.5  PLT 234  --  216   Recent Labs  Lab 01/30/20 0039 01/30/20 0048 02/01/20 0313 02/02/20 0155  NA 133* 135  --  135  K 4.0 3.9 4.4 4.2  CL 98 99  --  103  CO2 24  --   --  22  GLUCOSE 300* 301*  --  187*  BUN 11 12  --  15  CREATININE 0.82 0.60*  --  0.81  CALCIUM 9.4  --   --  9.2  MG  --   --  2.0  --    Lab Results  Component Value Date   HGBA1C 11.0 (H) 01/30/2020       Component Value Date/Time   CHOL 295 (H) 01/30/2020 0559   TRIG 164 (H) 01/30/2020 0559   HDL 65 01/30/2020 0559   CHOLHDL 4.5 01/30/2020 0559   VLDL 33 01/30/2020 0559   LDLCALC 197 (H) 01/30/2020 0559    Signed, Lorin Glass, MD [facility] Pager: [phone]  (Secure Chat preferred). 02/03/2020

## 2020-02-03 NOTE — Progress Notes (Signed)
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 Patient mod I for bed mobility, supervision for sit to stand at EOB and min G with functional ambulation in room without AD. Min G for safety with turning/ negotiating obstacles in room due to minor loss of balance. Patient participate in functional task of item retrieval and working on fine + gross motor coordination with functional objects. Patient require increase time due to decreased fine motor coordination to open containers, however patient reports he has been practicing and has improved from previous days. Continue to recommend D/C to venue listed below as patient was fully I at baseline and employed in highly physical occupation which patient is very much wanting to get back to doing.     02/03/20 1300  OT Visit Information  Last OT Received On 02/03/20  Assistance Needed +1  History of Present Illness Pt is 49 yo male admitted with L thalamus CVA with R sided weakness and numbness.  Permissive HTN of 220/120 per neuro.  PMH of DM and HTN.  Precautions  Precautions Fall  Precaution Comments R sided numbness  Pain Assessment  Pain Assessment Faces  Faces Pain Scale 0  Cognition  Arousal/Alertness Awake/alert  Behavior During Therapy WFL for tasks assessed/performed  Overall Cognitive Status Within Functional Limits for tasks assessed  General Comments patient is very motivated but agreeable to seated rest breaks between tasks this session   Upper Extremity Assessment  RUE Deficits / Details grossly 4+/5 MMT, dysmetric with poor sensation   RUE Coordination decreased fine motor;decreased gross motor  ADL  Overall ADL's  Needs assistance/impaired  Grooming Supervision/safety;Standing  Grooming Details (indicate cue type and reason) patient requiring increased time but able to manage grooming items standing sink side while praticing fine motor  coordination with R hand  Toilet Transfer Min guard;Ambulation;Regular Social worker Details (indicate cue type and reason) simulated in room, min G for safety with turning due to minor loss of balance  Functional mobility during ADLs Min guard;Cueing for safety  General ADL Comments patient participate in functional task of item retrevial working on balance, fine motor/gross motor coordination, visual scanning while navigating room environment. Patient min G assist for balance throughout requiring increased time with [facility] tasks.  Bed Mobility  Overal bed mobility Modified Independent  Balance  Overall balance assessment Needs assistance  Sitting-balance support No upper extremity supported;Feet supported  Sitting balance-Leahy Scale Good  Standing balance support No upper extremity supported  Standing balance-Leahy Scale Good  Standing balance comment min G for safety  Transfers  Overall transfer level Needs assistance  Equipment used None  Transfers Sit to/from Stand  Sit to Stand Supervision  General transfer comment supervision for safety with sit to stand from EOB x3, no physical assist required   OT - End of Session  Activity Tolerance Patient tolerated treatment well  Patient left in bed;with call bell/phone within reach;with bed alarm set  Nurse Communication Mobility status  OT Assessment/Plan  OT Plan Discharge plan remains appropriate  OT Visit Diagnosis Other abnormalities of gait and mobility (R26.89);Muscle weakness (generalized) (M62.81);Other symptoms and signs involving the nervous system (R29.898);Other symptoms and signs involving cognitive function;Feeding difficulties (R63.3);Hemiplegia and hemiparesis  Hemiplegia - Right/Left Right  Hemiplegia - dominant/non-dominant Dominant  Hemiplegia - caused by Cerebral infarction  OT Frequency (ACUTE ONLY) Min 2X/week  Follow Up Recommendations CIR  OT Equipment Other (comment) (TBD at next venue  of care)  AM-PAC  OT "6 Clicks" Daily Activity Outcome Measure (Version 2)  Help from another person eating meals? 3  Help from another person taking care of personal grooming? 3  Help from another person toileting, which includes using toliet, bedpan, or urinal? 3  Help from another person bathing (including washing, rinsing, drying)? 3  Help from another person to put on and taking off regular upper body clothing? 3  Help from another person to put on and taking off regular lower body clothing? 3  6 Click Score 18  OT Goal Progression  Progress towards OT goals Progressing toward goals  Acute Rehab OT Goals  Patient Stated Goal to get to rehab, and eventually get back to work  OT Goal Formulation With patient  Time For Goal Achievement 02/15/20  Potential to Achieve Goals Good  ADL Goals  Pt Will Perform Eating with modified independence;with adaptive utensils;sitting  Pt Will Perform Grooming with modified independence;sitting;standing  Pt Will Perform Lower Body Dressing with modified independence;sit to/from stand  Pt Will Transfer to Toilet with modified independence;ambulating  Pt Will Perform Toileting - Clothing Manipulation and hygiene with modified independence;sit to/from stand  Pt/caregiver will Perform Home Exercise Program Right Upper extremity;With written HEP provided;With theraputty;With theraband  OT Time Calculation  OT Start Time (ACUTE ONLY) 1205  OT Stop Time (ACUTE ONLY) 1230  OT Time Calculation (min) 25 min  OT General Charges  $OT Visit 1 Visit  OT Treatments  $Self Care/Home Management  23-37 mins   Marlyce Huge OT OT pager: [phone]

## 2020-02-03 NOTE — Progress Notes (Signed)
Inpatient Rehab Admissions Coordinator:   Left voicemail for case manager at insurance company checking status of prior auth request.  Will continue to follow.   Estill Dooms, PT, DPT Admissions Coordinator [phone] 02/03/20  2:18 PM

## 2020-02-04 DIAGNOSIS — I10 Essential (primary) hypertension: Secondary | ICD-10-CM | POA: Diagnosis not present

## 2020-02-04 LAB — GLUCOSE, CAPILLARY
Glucose-Capillary: 153 mg/dL — ABNORMAL HIGH (ref 70–99)
Glucose-Capillary: 119 mg/dL — ABNORMAL HIGH (ref 70–99)
Glucose-Capillary: 128 mg/dL — ABNORMAL HIGH (ref 70–99)
Glucose-Capillary: 202 mg/dL — ABNORMAL HIGH (ref 70–99)

## 2020-02-04 MED ORDER — METFORMIN HCL 500 MG PO TABS
1000.0000 mg | ORAL_TABLET | Freq: Two times a day (BID) | ORAL | Status: DC
  Administered 2020-02-04 – 2020-02-05 (×4): 1000 mg via ORAL
  Filled 2020-02-04 (×4): qty 2

## 2020-02-04 NOTE — Progress Notes (Signed)
PROGRESS NOTE  Peter Tran  DOB: January 26, 1971  PCP: Patient, No Pcp Per KAJ:[id number]  DOA: 01/30/2020  LOS: 5 days   No chief complaint on file.  Brief narrative: Peter Tran is a 49 y.o. male with PMH of colitis and hypertension who had not seen a physician for last 4 years and was not taking any medicine at home. Patient presented to the ED on 01/30/2020 with complaint of headache, right-sided numbness and word finding difficulties. EMS noted blood pressure to be 260/160. Brought to ED as a stroke alert. Stat CT head showed acute lacunar infarct at the lateral left thalamus. MRI confirmed CT finding. CT angio of head and neck did not show any emergent large vessel occlusion.  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 Seen by neurology. Admitted to hospital service for stroke work-up.  Subjective: Patient was seen and examined this afternoon. Lying on bed.  Not in distress. Pending insurance authorization for CIR.  Blood pressure and blood sugar stable. Seen by PT again today.  Patient is still unsteady and at risk of fall.  Patient prefers to wait till Monday to hear from CIR  Assessment/Plan: Acute ischemic stroke-left lateral thalamus -Presented with right-sided numbness, headache and word finding difficulty. -CT and MRI finding as above showing acute infarct of lateral left thrombus -Stroke work-up started. -LDL 197, HDL 65, A1c 11 -echocardiogram with EF 55 to 60% and no intracardiac source of embolism. -Neurology consultation appreciated. -PT/OT/ST eval appreciated.  CIR recommended. Pending insurance authorization. -Started on dual antiplatelet therapy with aspirin 81 mg daily and Plavix 75 mg daily and Lipitor 80 mg daily.  Uncontrolled diabetes mellitus type 2 with hyperglycemia- A1c 11 -Currently on Lantus 20 units daily and Metformin 500 mg twice daily. -Blood sugar level coming under control. -Continue sliding scale insulin  Accu-Cheks. -Diabetes coordinator consult appreciated.  Hypertensive urgency -Not on blood pressure at home.  Presented with a blood pressure of 209/129 -Permissive hypertension allowed for first 24 hours. -Currently he is on lisinopril 10 mg daily and amlodipine 10 mg daily.   -Blood pressure stable in the 120s last 24 hours. Continue to monitor.  May need further adjustment.    Dyslipidemia -LDL 197, HDL 65 -Started on Lipitor 80 mg daily.  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 -No h/o syncope or palpitations per patient. -Cardiology consult appreciated. -Echocardiogram with normal EF and no congenital defect. -Cardiology plans for Zio patch as an outpatient and follow-up with Dr. Antoine Poche.  Mobility: PT eval appreciated.  Code Status:   Code Status: Full Code  Nutritional status: Body mass index is 39.53 kg/m.     Diet Order            Diet Carb Modified Fluid consistency: Thin; Room service appropriate? Yes  Diet effective now                 DVT prophylaxis: enoxaparin (LOVENOX) injection 40 mg Start: 01/30/20 1000   Antimicrobials:  None Fluid: None  Consultants: Neurology, cardiology Family Communication:  None at bedside  Status is: Inpatient  Remains inpatient appropriate because:Ongoing diagnostic testing needed not appropriate for outpatient work up   Dispo: The patient is from: Home              Anticipated d/c is to: CIR              Anticipated d/c date is: Medically stable for discharge, pending insurance authorization  Infusions:    Scheduled Meds: . amLODipine  10 mg Oral Daily  . aspirin EC  81 mg Oral Daily  . atorvastatin  80 mg Oral Daily  . clopidogrel  75 mg Oral Daily  . enoxaparin (LOVENOX) injection  40 mg Subcutaneous Q24H  . insulin aspart  0-15 Units Subcutaneous TID WC  . insulin aspart  0-5 Units Subcutaneous QHS  . insulin glargine  25 Units Subcutaneous Daily  . lisinopril  10 mg Oral Daily   . metFORMIN  1,000 mg Oral BID WC    Antimicrobials: Anti-infectives (From admission, onward)   None      PRN meds: acetaminophen **OR** acetaminophen (TYLENOL) oral liquid 160 mg/5 mL **OR** acetaminophen, hydrALAZINE   Objective: Vitals:   02/04/20 0811 02/04/20 1136  BP: 138/88 (!) 141/89  Pulse: 76 73  Resp: 12 20  Temp: 98.4 F (36.9 C) 98.5 F (36.9 C)  SpO2: 97% 99%    Intake/Output Summary (Last 24 hours) at 02/04/2020 1421 Last data filed at 02/04/2020 0812 Gross per 24 hour  Intake 240 ml  Output 1400 ml  Net -1160 ml   Filed Weights   01/30/20 0033  Weight: 117.9 kg   Weight change:  Body mass index is 39.53 kg/m.   Physical Exam: General exam: Appears calm and comfortable.  Not in physical distress Skin: No rashes, lesions or ulcers. HEENT: Atraumatic, normocephalic, supple neck, no obvious bleeding Lungs: Clear to auscultation bilaterally CVS: Regular rate and rhythm, no murmur GI/Abd soft, nontender, nondistended, bowel sound present CNS: Alert, awake, oriented x3.  Psychiatry: Mood appropriate Extremities: No pedal edema, no calf tenderness  Data Review: I have personally reviewed the laboratory data and studies available.  Recent Labs  Lab 01/30/20 0039 01/30/20 0048 02/02/20 0155  WBC 13.4*  --  9.7  NEUTROABS 10.4*  --  6.2  HGB 17.5* 17.0 16.1  HCT 49.5 50.0 46.1  MCV 88.4  --  89.5  PLT 234  --  216   Recent Labs  Lab 01/30/20 0039 01/30/20 0048 02/01/20 0313 02/02/20 0155  NA 133* 135  --  135  K 4.0 3.9 4.4 4.2  CL 98 99  --  103  CO2 24  --   --  22  GLUCOSE 300* 301*  --  187*  BUN 11 12  --  15  CREATININE 0.82 0.60*  --  0.81  CALCIUM 9.4  --   --  9.2  MG  --   --  2.0  --    Lab Results  Component Value Date   HGBA1C 11.0 (H) 01/30/2020       Component Value Date/Time   CHOL 295 (H) 01/30/2020 0559   TRIG 164 (H) 01/30/2020 0559   HDL 65 01/30/2020 0559   CHOLHDL 4.5 01/30/2020 0559   VLDL 33  01/30/2020 0559   LDLCALC 197 (H) 01/30/2020 0559    Signed, Lorin Glass, MD [facility] Pager: [phone] (Secure Chat preferred). 02/04/2020

## 2020-02-04 NOTE — Progress Notes (Signed)
Physical Therapy Treatment Patient Details Name: Peter Tran MRN: [id number] DOB: [birth year] Today's Date: 02/04/2020    History of Present Illness Pt is 49 yo male admitted with L thalamus CVA with R sided weakness and numbness.  Permissive HTN of 220/120 per neuro.  PMH of DM and HTN.    PT Comments    Pt is progressing well towards goals and demonstrated improved safety awareness this session. Noted increased difficulty in reaching activities with R UE when crossing midline. Pt with 1x LOB with toe taps on 6" step, min A to correct. Pt continues to be a fall risk and would be an excellent candidate for CIR to maximize his safety with mobility to prevent falls. He is very motivate to recover and responds well to instructions with good carryover learning. Will continue to follow acutely.    Follow Up Recommendations  CIR     Equipment Recommendations  None recommended by PT    Recommendations for Other Services Rehab consult     Precautions / Restrictions Precautions Precautions: Fall Precaution Comments: R sided numbness Restrictions Weight Bearing Restrictions: No    Mobility  Bed Mobility Overal bed mobility: Modified Independent                Transfers Overall transfer level: Needs assistance Equipment used: None Transfers: Sit to/from Stand Sit to Stand: Supervision         General transfer comment: supervision for safety. No assist needed  Ambulation/Gait Ambulation/Gait assistance: Supervision Gait Distance (Feet): 450 Feet Assistive device: None Gait Pattern/deviations: Step-to pattern;Step-through pattern;Decreased stride length;Wide base of support Gait velocity: decreased   General Gait Details: Noted improved reciprocal arm swing this session. Pt ambulated 15 ft counting each step, then challenged to ambulate the same distance in fewer steps to encourage increased step length. Pt able to maintain increased step length throughout remainder of  ambulation.     Stairs             Wheelchair Mobility    Modified Rankin (Stroke Patients Only) Modified Rankin (Stroke Patients Only) Pre-Morbid Rankin Score: No symptoms Modified Rankin: Moderately severe disability     Balance Overall balance assessment: Needs assistance Sitting-balance support: Feet supported Sitting balance-Leahy Scale: Good     Standing balance support: No upper extremity supported Standing balance-Leahy Scale: Good Standing balance comment: min G for safety                            Cognition Arousal/Alertness: Awake/alert Behavior During Therapy: WFL for tasks assessed/performed Overall Cognitive Status: Within Functional Limits for tasks assessed Area of Impairment: Safety/judgement;Problem solving                         Safety/Judgement: Decreased awareness of safety   Problem Solving: Slow processing;Requires verbal cues General Comments: Safety awareness seems improved as pt is more willing to take brakes and verbally stated he was trying to slow down when ambulating in the hall.      Exercises Other Exercises Other Exercises: Alternating toe taps 10x each LE. Randomized toe taps to the L, R, and  middle on each LE.  Other Exercises: Reaching activities. Reaching for therapist hand in a randomized pattern. More difficulty reaching with the R UE.    General Comments General comments (skin integrity, edema, etc.): VSS      Pertinent Vitals/Pain Pain Assessment: No/denies pain    Home Living  Prior Function            PT Goals (current goals can now be found in the care plan section) Acute Rehab PT Goals Patient Stated Goal: to get to rehab, and eventually get back to work PT Goal Formulation: With patient Time For Goal Achievement: 02/13/20 Potential to Achieve Goals: Good Progress towards PT goals: Progressing toward goals    Frequency    Min 4X/week      PT  Plan Current plan remains appropriate    Co-evaluation              AM-PAC PT "6 Clicks" Mobility   Outcome Measure  Help needed turning from your back to your side while in a flat bed without using bedrails?: None Help needed moving from lying on your back to sitting on the side of a flat bed without using bedrails?: None Help needed moving to and from a bed to a chair (including a wheelchair)?: A Little Help needed standing up from a chair using your arms (e.g., wheelchair or bedside chair)?: A Little Help needed to walk in hospital room?: A Little Help needed climbing 3-5 steps with a railing? : A Little 6 Click Score: 20    End of Session Equipment Utilized During Treatment: Gait belt Activity Tolerance: Patient tolerated treatment well Patient left: in bed;with call bell/phone within reach;with bed alarm set Nurse Communication: Mobility status PT Visit Diagnosis: Other symptoms and signs involving the nervous system (R29.898);Other abnormalities of gait and mobility (R26.89);Muscle weakness (generalized) (M62.81)     Time: 2620-3559 PT Time Calculation (min) (ACUTE ONLY): 21 min  Charges:  $Neuromuscular Re-education: 8-22 mins                     Peter Tran, [place] Pager [phone] Acute Rehab    Peter Tran 02/04/2020, 1:18 PM

## 2020-02-05 DIAGNOSIS — I10 Essential (primary) hypertension: Secondary | ICD-10-CM | POA: Diagnosis not present

## 2020-02-05 LAB — CBC WITH DIFFERENTIAL/PLATELET
Abs Immature Granulocytes: 0.04 10*3/uL (ref 0.00–0.07)
Basophils Absolute: 0.1 10*3/uL (ref 0.0–0.1)
Basophils Relative: 1 %
Eosinophils Absolute: 0.2 10*3/uL (ref 0.0–0.5)
Eosinophils Relative: 2 %
HCT: 47.8 % (ref 39.0–52.0)
Hemoglobin: 16.5 g/dL (ref 13.0–17.0)
Immature Granulocytes: 0 %
Lymphocytes Relative: 21 %
Lymphs Abs: 2.3 10*3/uL (ref 0.7–4.0)
MCH: 30.4 pg (ref 26.0–34.0)
MCHC: 34.5 g/dL (ref 30.0–36.0)
MCV: 88.2 fL (ref 80.0–100.0)
Monocytes Absolute: 1.2 10*3/uL — ABNORMAL HIGH (ref 0.1–1.0)
Monocytes Relative: 11 %
Neutro Abs: 7 10*3/uL (ref 1.7–7.7)
Neutrophils Relative %: 65 %
Platelets: 271 10*3/uL (ref 150–400)
RBC: 5.42 MIL/uL (ref 4.22–5.81)
RDW: 11.4 % — ABNORMAL LOW (ref 11.5–15.5)
WBC: 10.8 10*3/uL — ABNORMAL HIGH (ref 4.0–10.5)
nRBC: 0 % (ref 0.0–0.2)

## 2020-02-05 LAB — BASIC METABOLIC PANEL
Anion gap: 9 (ref 5–15)
BUN: 12 mg/dL (ref 6–20)
CO2: 23 mmol/L (ref 22–32)
Calcium: 9.3 mg/dL (ref 8.9–10.3)
Chloride: 101 mmol/L (ref 98–111)
Creatinine, Ser: 0.72 mg/dL (ref 0.61–1.24)
GFR calc Af Amer: >60 mL/min (ref >60)
GFR calc non Af Amer: >60 mL/min (ref >60)
Glucose, Bld: 122 mg/dL — ABNORMAL HIGH (ref 70–99)
Potassium: 4.4 mmol/L (ref 3.5–5.1)
Sodium: 133 mmol/L — ABNORMAL LOW (ref 135–145)

## 2020-02-05 LAB — GLUCOSE, CAPILLARY
Glucose-Capillary: 128 mg/dL — ABNORMAL HIGH (ref 70–99)
Glucose-Capillary: 121 mg/dL — ABNORMAL HIGH (ref 70–99)
Glucose-Capillary: 161 mg/dL — ABNORMAL HIGH (ref 70–99)
Glucose-Capillary: 140 mg/dL — ABNORMAL HIGH (ref 70–99)

## 2020-02-05 NOTE — Progress Notes (Signed)
PROGRESS NOTE  Peter Tran  DOB: 1971-02-21  PCP: Patient, No Pcp Per ONG:[id number]  DOA: 01/30/2020  LOS: 6 days   Chief complaint: Right-sided numbness, word finding difficulties  Brief narrative: Peter Tran is a 49 y.o. male with PMH of colitis and hypertension who had not seen a physician for last 4 years and was not taking any medicine at home. Patient presented to the ED on 01/30/2020 with complaint of headache, right-sided numbness and word finding difficulties. EMS noted blood pressure to be 260/160. Brought to ED as a stroke alert. Stat CT head showed acute lacunar infarct at the lateral left thalamus. MRI confirmed CT finding. CT angio of head and neck did not show any emergent large vessel occlusion.  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 Seen by neurology. Admitted to hospital service for stroke work-up.  Subjective: Patient was seen and examined this morning. Lying on bed.  Not in distress. He reports an incidence of yesterday evening when she got dizzy.  It did not occur to him to check blood glucose levels but he took some snacks and felt better. Overnight, blood pressure in 140s Blood sugar level have remained stable. Labs this morning showed normal renal function.  Assessment/Plan: Acute ischemic stroke-left lateral thalamus -Presented with right-sided numbness, headache and word finding difficulty. -CT and MRI finding as above showing acute infarct of lateral left thrombus -Stroke work-up started. -LDL 197, HDL 65, A1c 11 -echocardiogram with EF 55 to 60% and no intracardiac source of embolism. -Neurology consultation appreciated. -PT/OT/ST eval appreciated.  CIR recommended. Pending insurance authorization. -Started on dual antiplatelet therapy with aspirin 81 mg daily and Plavix 75 mg daily for 3 weeks followed by aspirin alone starting 02/20/2020.  Continue Lipitor 80 mg daily.  Uncontrolled diabetes mellitus type 2 with  hyperglycemia- A1c 11 -Currently on Lantus 20 units daily and Metformin 500 mg twice daily. -Patient possibly had hypoglycemic symptoms last night. I have educated patient about common hypoglycemic symptoms and the need to check blood sugar if he is to feel those symptoms -Continue sliding scale insulin Accu-Cheks. -Diabetes coordinator consult appreciated.  Hypertensive urgency -Not on blood pressure at home.  Presented with a blood pressure of 209/129 -Permissive hypertension allowed for first 24 hours. -Currently he is on lisinopril 10 mg daily and amlodipine 10 mg daily.   -Blood pressure stable in the 140s last 24 hours. Continue to monitor.  May need further adjustment.    Dyslipidemia -LDL 197, HDL 65 -Continue Lipitor 80 mg daily.  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 -No h/o syncope or palpitations per patient. -Cardiology consult appreciated. -Echocardiogram with normal EF and no congenital defect. -Cardiology plans for Zio patch as an outpatient and follow-up with Dr. Antoine Poche.  Mobility: PT eval appreciated.  Code Status:   Code Status: Full Code  Nutritional status: Body mass index is 39.53 kg/m.     Diet Order            Diet Carb Modified Fluid consistency: Thin; Room service appropriate? Yes  Diet effective now                 DVT prophylaxis: enoxaparin (LOVENOX) injection 40 mg Start: 01/30/20 1000   Antimicrobials:  None Fluid: None  Consultants: Neurology, cardiology Family Communication:  None at bedside  Status is: Inpatient  Remains inpatient appropriate because:Ongoing diagnostic testing needed not appropriate for outpatient work up   Dispo: The patient is from: Home  Anticipated d/c is to: CIR              Anticipated d/c date is: Medically stable for discharge, pending insurance authorization               Infusions:    Scheduled Meds: . amLODipine  10 mg Oral Daily  . aspirin EC  81 mg Oral Daily  .  atorvastatin  80 mg Oral Daily  . clopidogrel  75 mg Oral Daily  . enoxaparin (LOVENOX) injection  40 mg Subcutaneous Q24H  . insulin aspart  0-15 Units Subcutaneous TID WC  . insulin aspart  0-5 Units Subcutaneous QHS  . insulin glargine  25 Units Subcutaneous Daily  . lisinopril  10 mg Oral Daily  . metFORMIN  1,000 mg Oral BID WC    Antimicrobials: Anti-infectives (From admission, onward)   None      PRN meds: acetaminophen **OR** acetaminophen (TYLENOL) oral liquid 160 mg/5 mL **OR** acetaminophen, hydrALAZINE   Objective: Vitals:   02/05/20 0343 02/05/20 0739  BP: 134/84 (!) 149/87  Pulse: 73 69  Resp: 18 19  Temp: 98.1 F (36.7 C) 98 F (36.7 C)  SpO2: 99% 97%    Intake/Output Summary (Last 24 hours) at 02/05/2020 1022 Last data filed at 02/04/2020 1629 Gross per 24 hour  Intake 480 ml  Output --  Net 480 ml   Filed Weights   01/30/20 0033  Weight: 117.9 kg   Weight change:  Body mass index is 39.53 kg/m.   Physical Exam: General exam: Appears calm and comfortable.  Not in physical distress Skin: No rashes, lesions or ulcers. HEENT: Atraumatic, normocephalic, supple neck, no obvious bleeding Lungs: Clear to auscultation bilaterally CVS: Regular rate and rhythm, no murmur GI/Abd soft, nontender, nondistended, bowel sound present CNS: Alert, awake, oriented x3.  Psychiatry: Mood appropriate Extremities: No pedal edema, no calf tenderness  Data Review: I have personally reviewed the laboratory data and studies available.  Recent Labs  Lab 01/30/20 0039 01/30/20 0048 02/02/20 0155 02/05/20 0801  WBC 13.4*  --  9.7 10.8*  NEUTROABS 10.4*  --  6.2 7.0  HGB 17.5* 17.0 16.1 16.5  HCT 49.5 50.0 46.1 47.8  MCV 88.4  --  89.5 88.2  PLT 234  --  216 271   Recent Labs  Lab 01/30/20 0039 01/30/20 0048 02/01/20 0313 02/02/20 0155 02/05/20 0801  NA 133* 135  --  135 133*  K 4.0 3.9 4.4 4.2 4.4  CL 98 99  --  103 101  CO2 24  --   --  22 23   GLUCOSE 300* 301*  --  187* 122*  BUN 11 12  --  15 12  CREATININE 0.82 0.60*  --  0.81 0.72  CALCIUM 9.4  --   --  9.2 9.3  MG  --   --  2.0  --   --    Lab Results  Component Value Date   HGBA1C 11.0 (H) 01/30/2020       Component Value Date/Time   CHOL 295 (H) 01/30/2020 0559   TRIG 164 (H) 01/30/2020 0559   HDL 65 01/30/2020 0559   CHOLHDL 4.5 01/30/2020 0559   VLDL 33 01/30/2020 0559   LDLCALC 197 (H) 01/30/2020 0559    Signed, Lorin Glass, MD [facility] Pager: [phone] (Secure Chat preferred). 02/05/2020

## 2020-02-06 DIAGNOSIS — I10 Essential (primary) hypertension: Secondary | ICD-10-CM | POA: Diagnosis not present

## 2020-02-06 LAB — GLUCOSE, CAPILLARY
Glucose-Capillary: 123 mg/dL — ABNORMAL HIGH (ref 70–99)
Glucose-Capillary: 156 mg/dL — ABNORMAL HIGH (ref 70–99)

## 2020-02-06 MED ORDER — CLOPIDOGREL BISULFATE 75 MG PO TABS: 75.0000 mg | ORAL_TABLET | Freq: Every day | ORAL | 0 refills | Status: AC

## 2020-02-06 MED ORDER — BLOOD GLUCOSE METER KIT: PACK | 0 refills | Status: AC

## 2020-02-06 MED ORDER — ATORVASTATIN CALCIUM 80 MG PO TABS: 80.0000 mg | ORAL_TABLET | Freq: Every day | ORAL | 0 refills | Status: AC

## 2020-02-06 MED ORDER — INSULIN DETEMIR 100 UNIT/ML FLEXPEN: 25.0000 [IU] | PEN_INJECTOR | Freq: Every day | SUBCUTANEOUS | 0 refills | Status: DC

## 2020-02-06 MED ORDER — LISINOPRIL 10 MG PO TABS: 10.0000 mg | ORAL_TABLET | Freq: Every day | ORAL | 0 refills | Status: DC

## 2020-02-06 MED ORDER — ASPIRIN 81 MG PO TBEC: 81.0000 mg | DELAYED_RELEASE_TABLET | Freq: Every day | ORAL | 0 refills | Status: AC

## 2020-02-06 MED ORDER — METFORMIN HCL 1000 MG PO TABS: 1000.0000 mg | ORAL_TABLET | Freq: Two times a day (BID) | ORAL | 0 refills | Status: AC

## 2020-02-06 MED ORDER — AMLODIPINE BESYLATE 10 MG PO TABS: 10.0000 mg | ORAL_TABLET | Freq: Every day | ORAL | 0 refills | Status: AC

## 2020-02-06 NOTE — Discharge Summary (Signed)
Physician Discharge Summary  Peter Tran BWG:[id number] DOB: [birth year] DOA: 01/30/2020  PCP: Patient, No Pcp Per  Admit date: 01/30/2020 Discharge date: 02/06/2020  Admitted From: Home Discharge disposition: Home with home health PT, OT, ST   Code Status: Full Code  Diet Recommendation: Cardiac/diabetic diet  Discharge Diagnosis:   Principal Problem:   Acute ischemic stroke ([place]) Active Problems:   DM2 (diabetes mellitus, type 2) ([place])   HTN (hypertension)   Wolff-Parkinson-White (WPW) pattern seen on electrocardiography   Wolff Parkinson White pattern seen on electrocardiogram   Tobacco abuse  History of Present Illness / Brief narrative:  Peter Tran is a 49 y.o. male with PMH of colitis and hypertension who had not seen a physician for last 4 years and was not taking any medicine at home. Patient presented to the ED on 01/30/2020 with complaint of headache, right-sided numbness and word finding difficulties. EMS noted blood pressure to be 260/160. Brought to ED as a stroke alert. Stat CT head showed acute lacunar infarct at the lateral left thalamus. MRI confirmed CT finding. CT angio of head and neck did not show any emergent large vessel occlusion.  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 Seen by neurology. Admitted to hospital service for stroke work-up.  Hospital Course:  Acute ischemic stroke-left lateral thalamus -Presented with right-sided numbness, headache and word finding difficulty. -CT and MRI finding as above showing acute infarct of lateral left thrombus -Stroke work-up started. -LDL 197, HDL 65, A1c 11 -echocardiogram with EF 55 to 60% and no intracardiac source of embolism. -PT/OT/ST eval appreciated.  Patient is getting discharged home with home health PT, OT, speech. -Seen by neurology. -Started on dual antiplatelet therapy with aspirin 81 mg daily and Plavix 75 mg daily for 3 weeks followed by aspirin alone starting 02/20/2020.  Continue Lipitor 80 mg daily.  Uncontrolled diabetes mellitus type 2 with hyperglycemia- A1c 11 -Currently on Lantus 25 units daily and Metformin 1000 mg twice daily. -Prescriptions sent to pharmacy for Levemir pen (preferred by his insurance plan), Metformin, glucometer, test strips, lancets. -I have educated patient about common hypoglycemic symptoms and the need to check blood sugar if he is to feel those symptoms   Hypertensive urgency -Not on blood pressure at home.  Presented with a blood pressure of 209/129 -Currently he is on lisinopril 10 mg daily and amlodipine 10 mg daily.   -Discharged on the same regimen.  Dyslipidemia -LDL 197, HDL 65 -Continue Lipitor 80 mg daily.  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 -No h/o syncope or palpitations per patient. -Cardiology consult appreciated. -Echocardiogram with normal EF and no congenital defect. -Cardiology plans for Zio patch as an outpatient and follow-up with Dr. Percival Spanish.  Mobility: PT eval appreciated.  Code Status:  Code Status: Full Code   Wound care:    Subjective:  Seen and examined this afternoon.  Pleasant, middle-aged Caucasian male.  Not in distress. Ready to go home.  Discharge Exam:   Vitals:   02/05/20 2352 02/06/20 0400 02/06/20 0759 02/06/20 1133  BP: 135/74 137/78 (!) 145/85   Pulse: 64 72 74 92  Resp:   16 18  Temp: 98.3 F (36.8 C) 98.4 F (36.9 C) 98.1 F (36.7 C) 98.3 F (36.8 C)  TempSrc: Oral Oral Oral Oral  SpO2: 97% 98% 96% 98%  Weight:      Height:        Body mass index is 39.53 kg/m.  General  exam: Appears calm and comfortable.  Not in physical distress Skin: No rashes, lesions or ulcers. HEENT: Atraumatic, normocephalic, supple neck, no obvious bleeding Lungs: Clear to auscultation bilaterally CVS: Regular rate and rhythm, no murmur GI/Abd soft, nontender, nondistended, bowel sound present CNS: Alert, awake, oriented x3 Psychiatry: Mood  appropriate Extremities: No pedal edema, no calf tenderness  Follow ups:   Discharge Instructions    Ambulatory referral to Neurology   Complete by: As directed    Follow up with stroke clinic NP (Jessica Vanschaick or Cecille Rubin, if both not available, consider Zachery Dauer, or [place]) at [facility] in about 4 weeks. Thanks.   Ambulatory referral to Physical Medicine Rehab   Complete by: As directed    1 month post stroke follow up   Diet - low sodium heart healthy   Complete by: As directed    Diet Carb Modified   Complete by: As directed    Increase activity slowly   Complete by: As directed       Follow-up Information    [facility]. Schedule an appointment as soon as possible for a visit in 4 week(s).   Specialty: Neurology Contact information: [address] [place] [phone]       Minus Breeding, MD Follow up.   Specialty: Cardiology Contact information: [address] [place] [address] [place] [facility] Follow up.   Contact information: [address] [place] [id number] [phone]              Recommendations for Outpatient Follow-Up:   1. Follow-up with PCP as an outpatient 2. Follow-up with cardiology as an outpatient 3. Follow with neurology as an outpatient  Discharge Instructions:  Follow with Primary MD Patient, No Pcp Per in 7 days   Get CBC/BMP checked in next visit within 1 week by PCP or SNF MD ( we routinely change or add medications that can affect your baseline labs and fluid status, therefore we recommend that you get the mentioned basic workup next visit with your PCP, your PCP may decide not to get them or add new tests based on their clinical decision)  On your next visit with your PCP, please Get Medicines reviewed and adjusted.  Please request your PCP  to go over all Hospital Tests and  Procedure/Radiological results at the follow up, please get all Hospital records sent to your Prim MD by signing hospital release before you go home.  Activity: As tolerated with Full fall precautions use walker/cane & assistance as needed  For Heart failure patients - Check your Weight same time everyday, if you gain over 2 pounds, or you develop in leg swelling, experience more shortness of breath or chest pain, call your Primary MD immediately. Follow Cardiac Low Salt Diet and 1.5 lit/day fluid restriction.  If you have smoked or chewed Tobacco in the last 2 yrs please stop smoking, stop any regular Alcohol  and or any Recreational drug use.  If you experience worsening of your admission symptoms, develop shortness of breath, life threatening emergency, suicidal or homicidal thoughts you must seek medical attention immediately by calling 911 or calling your MD immediately  if symptoms less severe.  You Must read complete instructions/literature along with all the possible adverse reactions/side effects for all the Medicines you take and that have been prescribed to you. Take any new Medicines after you have completely  understood and accpet all the possible adverse reactions/side effects.   Do not drive, operate heavy machinery, perform activities at heights, swimming or participation in water activities or provide baby sitting services if your were admitted for syncope or siezures until you have seen by Primary MD or a Neurologist and advised to do so again.  Do not drive when taking Pain medications.  Do not take more than prescribed Pain, Sleep and Anxiety Medications  Wear Seat belts while driving.   Please note You were cared for by a hospitalist during your hospital stay. If you have any questions about your discharge medications or the care you received while you were in the hospital after you are discharged, you can call the unit and asked to speak with the hospitalist on call if the  hospitalist that took care of you is not available. Once you are discharged, your primary care physician will handle any further medical issues. Please note that NO REFILLS for any discharge medications will be authorized once you are discharged, as it is imperative that you return to your primary care physician (or establish a relationship with a primary care physician if you do not have one) for your aftercare needs so that they can reassess your need for medications and monitor your lab values.    Allergies as of 02/06/2020   No Known Allergies     Medication List    TAKE these medications   amLODipine 10 MG tablet Commonly known as: NORVASC Take 1 tablet (10 mg total) by mouth daily. Start taking on: February 07, 2020   aspirin 81 MG EC tablet Take 1 tablet (81 mg total) by mouth daily. Swallow whole. Start taking on: February 07, 2020   atorvastatin 80 MG tablet Commonly known as: LIPITOR Take 1 tablet (80 mg total) by mouth daily. Start taking on: February 07, 2020   blood glucose meter kit and supplies Dispense based on patient and insurance preference. Use up to two times daily as directed. (FOR ICD-10 E10.9, E11.9).   clopidogrel 75 MG tablet Commonly known as: PLAVIX Take 1 tablet (75 mg total) by mouth daily for 13 days. Start taking on: February 07, 2020   insulin detemir 100 UNIT/ML FlexPen Commonly known as: LEVEMIR Inject 25 Units into the skin at bedtime.   lisinopril 10 MG tablet Commonly known as: ZESTRIL Take 1 tablet (10 mg total) by mouth daily. Start taking on: February 07, 2020   metFORMIN 1000 MG tablet Commonly known as: GLUCOPHAGE Take 1 tablet (1,000 mg total) by mouth 2 (two) times daily with a meal.       Time coordinating discharge: 35 minutes  The results of significant diagnostics from this hospitalization (including imaging, microbiology, ancillary and laboratory) are listed below for reference.    Procedures and Diagnostic Studies:   CT  ANGIO HEAD W OR WO CONTRAST  Result Date: 01/30/2020 CLINICAL DATA:  Neuro deficit, acute, stroke suspected EXAM: CT ANGIOGRAPHY HEAD AND NECK TECHNIQUE: Multidetector CT imaging of the head and neck was performed using the standard protocol during bolus administration of intravenous contrast. Multiplanar CT image reconstructions and MIPs were obtained to evaluate the vascular anatomy. Carotid stenosis measurements (when applicable) are obtained utilizing NASCET criteria, using the distal internal carotid diameter as the denominator. CONTRAST:  17m OMNIPAQUE IOHEXOL 350 MG/ML SOLN COMPARISON:  None. FINDINGS: CTA NECK FINDINGS SKELETON: There is no bony spinal canal stenosis. No lytic or blastic lesion. OTHER NECK: Normal pharynx, larynx and major salivary glands.  No cervical lymphadenopathy. Unremarkable thyroid gland. UPPER CHEST: No pneumothorax or pleural effusion. No nodules or masses. AORTIC ARCH: There is no calcific atherosclerosis of the aortic arch. There is no aneurysm, dissection or hemodynamically significant stenosis of the visualized portion of the aorta. Conventional 3 vessel aortic branching pattern. The visualized proximal subclavian arteries are widely patent. RIGHT CAROTID SYSTEM: Normal without aneurysm, dissection or stenosis. LEFT CAROTID SYSTEM: Normal without aneurysm, dissection or stenosis. VERTEBRAL ARTERIES: Right dominant configuration. Both origins are clearly patent. There is no dissection, occlusion or flow-limiting stenosis to the skull base (V1-V3 segments). CTA HEAD FINDINGS POSTERIOR CIRCULATION: --Vertebral arteries: Atherosclerotic calcification narrows the left V4 segment. The right is normal. --Inferior cerebellar arteries: Normal. --Basilar artery: Normal. --Superior cerebellar arteries: Normal. --Posterior cerebral arteries (PCA): Normal. ANTERIOR CIRCULATION: --Intracranial internal carotid arteries: There is moderate stenosis of the lacerum segment of the left internal  carotid artery secondary to noncalcified plaque. Otherwise, there is bilateral atherosclerotic calcification with mild multifocal stenosis. --Anterior cerebral arteries (ACA): Normal. Both A1 segments are present. Patent anterior communicating artery (a-comm). --Middle cerebral arteries (MCA): Normal. VENOUS SINUSES: As permitted by contrast timing, patent. ANATOMIC VARIANTS: None Review of the MIP images confirms the above findings. IMPRESSION: 1. No emergent large vessel occlusion. 2. Moderate stenosis of the lacerum segment of the left internal carotid artery secondary to noncalcified plaque. 3. Mild stenosis of the left vertebral artery V4 segment. 4. Aortic Atherosclerosis (ICD10-I70.0). Electronically Signed   By: Ulyses Jarred M.D.   On: 01/30/2020 02:13   DG Chest 2 View  Result Date: 01/30/2020 CLINICAL DATA:  Right-sided numbness EXAM: CHEST - 2 VIEW COMPARISON:  None. FINDINGS: The heart size and mediastinal contours are within normal limits. Both lungs are clear. The visualized skeletal structures are unremarkable. IMPRESSION: No active cardiopulmonary disease. Electronically Signed   By: Ulyses Jarred M.D.   On: 01/30/2020 02:36   CT ANGIO NECK W OR WO CONTRAST  Result Date: 01/30/2020 CLINICAL DATA:  Neuro deficit, acute, stroke suspected EXAM: CT ANGIOGRAPHY HEAD AND NECK TECHNIQUE: Multidetector CT imaging of the head and neck was performed using the standard protocol during bolus administration of intravenous contrast. Multiplanar CT image reconstructions and MIPs were obtained to evaluate the vascular anatomy. Carotid stenosis measurements (when applicable) are obtained utilizing NASCET criteria, using the distal internal carotid diameter as the denominator. CONTRAST:  176m OMNIPAQUE IOHEXOL 350 MG/ML SOLN COMPARISON:  None. FINDINGS: CTA NECK FINDINGS SKELETON: There is no bony spinal canal stenosis. No lytic or blastic lesion. OTHER NECK: Normal pharynx, larynx and major salivary glands.  No cervical lymphadenopathy. Unremarkable thyroid gland. UPPER CHEST: No pneumothorax or pleural effusion. No nodules or masses. AORTIC ARCH: There is no calcific atherosclerosis of the aortic arch. There is no aneurysm, dissection or hemodynamically significant stenosis of the visualized portion of the aorta. Conventional 3 vessel aortic branching pattern. The visualized proximal subclavian arteries are widely patent. RIGHT CAROTID SYSTEM: Normal without aneurysm, dissection or stenosis. LEFT CAROTID SYSTEM: Normal without aneurysm, dissection or stenosis. VERTEBRAL ARTERIES: Right dominant configuration. Both origins are clearly patent. There is no dissection, occlusion or flow-limiting stenosis to the skull base (V1-V3 segments). CTA HEAD FINDINGS POSTERIOR CIRCULATION: --Vertebral arteries: Atherosclerotic calcification narrows the left V4 segment. The right is normal. --Inferior cerebellar arteries: Normal. --Basilar artery: Normal. --Superior cerebellar arteries: Normal. --Posterior cerebral arteries (PCA): Normal. ANTERIOR CIRCULATION: --Intracranial internal carotid arteries: There is moderate stenosis of the lacerum segment of the left internal carotid artery secondary to noncalcified plaque.  Otherwise, there is bilateral atherosclerotic calcification with mild multifocal stenosis. --Anterior cerebral arteries (ACA): Normal. Both A1 segments are present. Patent anterior communicating artery (a-comm). --Middle cerebral arteries (MCA): Normal. VENOUS SINUSES: As permitted by contrast timing, patent. ANATOMIC VARIANTS: None Review of the MIP images confirms the above findings. IMPRESSION: 1. No emergent large vessel occlusion. 2. Moderate stenosis of the lacerum segment of the left internal carotid artery secondary to noncalcified plaque. 3. Mild stenosis of the left vertebral artery V4 segment. 4. Aortic Atherosclerosis (ICD10-I70.0). Electronically Signed   By: Ulyses Jarred M.D.   On: 01/30/2020 02:13    MR BRAIN WO CONTRAST  Result Date: 01/30/2020 CLINICAL DATA:  Stroke follow-up EXAM: MRI HEAD WITHOUT CONTRAST TECHNIQUE: Multiplanar, multiecho pulse sequences of the brain and surrounding structures were obtained without intravenous contrast. COMPARISON:  Brain MRI 02/26/2005 Head CT 01/30/2020 FINDINGS: Brain: Small acute infarct of the anterolateral left thalamus. Normal white matter signal. Normal volume of CSF spaces. No chronic microhemorrhage. Normal midline structures. Vascular: Normal flow voids. Skull and upper cervical spine: Normal marrow signal. Sinuses/Orbits: Negative. Other: None. IMPRESSION: Small acute infarct of the anterolateral left thalamus. No acute hemorrhage or mass effect. Electronically Signed   By: Ulyses Jarred M.D.   On: 01/30/2020 01:51   ECHOCARDIOGRAM COMPLETE  Result Date: 01/30/2020    ECHOCARDIOGRAM REPORT   Patient Name:   AVEN CHRISTEN Date of Exam: 01/30/2020 Medical Rec #:  [id number]    Height:       68.0 in Accession #:    [id number]   Weight:       260.0 lb Date of Birth:  [birth year]    BSA:          2.285 m Patient Age:    41 years     BP:           182/93 mmHg Patient Gender: M            HR:           79 bpm. Exam Location:  Inpatient Procedure: 2D Echo Indications:    stroke 434.91  History:        Patient has no prior history of Echocardiogram examinations.                 Risk Factors:Diabetes, Hypertension and Current Smoker.  Sonographer:    Jannett Celestine RDCS (AE) Referring Phys: [phone] JARED M GARDNER  Sonographer Comments: Patient is morbidly obese. see comments IMPRESSIONS  1. Left ventricular ejection fraction, by estimation, is 55 to 60%. The left ventricle has normal function. The left ventricle has no regional wall motion abnormalities. Left ventricular diastolic function could not be evaluated.  2. Right ventricular systolic function is normal. The right ventricular size is normal. Tricuspid regurgitation signal is inadequate for assessing PA pressure.   3. The mitral valve is degenerative. No evidence of mitral valve regurgitation. No evidence of mitral stenosis.  4. The aortic valve is tricuspid. Aortic valve regurgitation is not visualized. No aortic stenosis is present.  5. The inferior vena cava is normal in size with greater than 50% respiratory variability, suggesting right atrial pressure of 3 mmHg. Conclusion(s)/Recommendation(s): No intracardiac source of embolism detected on this transthoracic study. A transesophageal echocardiogram is recommended to exclude cardiac source of embolism if clinically indicated. FINDINGS  Left Ventricle: Left ventricular ejection fraction, by estimation, is 55 to 60%. The left ventricle has normal function. The left ventricle has no regional wall motion abnormalities. The left ventricular  internal cavity size was normal in size. There is  no left ventricular hypertrophy. Left ventricular diastolic function could not be evaluated due to mitral annular calcification (moderate or greater). Left ventricular diastolic function could not be evaluated. Right Ventricle: The right ventricular size is normal. No increase in right ventricular wall thickness. Right ventricular systolic function is normal. Tricuspid regurgitation signal is inadequate for assessing PA pressure. Left Atrium: Left atrial size was normal in size. Right Atrium: Right atrial size was normal in size. Pericardium: Trivial pericardial effusion is present. Mitral Valve: The mitral valve is degenerative in appearance. There is moderate calcification of the posterior mitral valve leaflet(s). Moderate mitral annular calcification. No evidence of mitral valve regurgitation. No evidence of mitral valve stenosis. Tricuspid Valve: The tricuspid valve is grossly normal. Tricuspid valve regurgitation is not demonstrated. No evidence of tricuspid stenosis. Aortic Valve: The aortic valve is tricuspid. Aortic valve regurgitation is not visualized. No aortic stenosis is  present. Pulmonic Valve: The pulmonic valve was grossly normal. Pulmonic valve regurgitation is not visualized. No evidence of pulmonic stenosis. Aorta: The aortic root is normal in size and structure. Venous: The inferior vena cava is normal in size with greater than 50% respiratory variability, suggesting right atrial pressure of 3 mmHg. IAS/Shunts: The atrial septum is grossly normal.  LEFT VENTRICLE PLAX 2D LVIDd:         4.40 cm  Diastology LVIDs:         3.00 cm  LV e' lateral:   8.59 cm/s LV PW:         1.00 cm  LV E/e' lateral: 12.8 LV IVS:        1.00 cm  LV e' medial:    6.64 cm/s LVOT diam:     2.20 cm  LV E/e' medial:  16.6 LV SV:         77 LV SV Index:   34 LVOT Area:     3.80 cm  LEFT ATRIUM           Index       RIGHT ATRIUM           Index LA diam:      3.40 cm 1.49 cm/m  RA Area:     17.90 cm LA Vol (A4C): 51.7 ml 22.63 ml/m RA Volume:   47.30 ml  20.70 ml/m  AORTIC VALVE LVOT Vmax:   111.00 cm/s LVOT Vmean:  72.500 cm/s LVOT VTI:    0.203 m  AORTA Ao Root diam: 3.10 cm MITRAL VALVE MV Area (PHT): 2.91 cm     SHUNTS MV Decel Time: 261 msec     Systemic VTI:  0.20 m MV E velocity: 110.00 cm/s  Systemic Diam: 2.20 cm MV A velocity: 114.00 cm/s MV E/A ratio:  0.96 Eleonore Chiquito MD Electronically signed by Eleonore Chiquito MD Signature Date/Time: 01/30/2020/1:03:04 PM    Final    CT HEAD CODE STROKE WO CONTRAST  Result Date: 01/30/2020 CLINICAL DATA:  Code stroke. 49 year old male with right side numbness and difficulty speaking last seen normal 1500 hours. EXAM: CT HEAD WITHOUT CONTRAST TECHNIQUE: Contiguous axial images were obtained from the base of the skull through the vertex without intravenous contrast. COMPARISON:  Brain MRI 02/26/2005.  Head CT 02/11/2005. FINDINGS: Brain: Cerebral volume appears normal. No midline shift, ventriculomegaly, mass effect, evidence of mass lesion, intracranial hemorrhage or evidence of cortically based acute infarction. Subtle new hypodensity in the left  lateral thalamus near the posterior limb of the  left internal capsule. Otherwise gray-white matter differentiation is within normal limits throughout the brain. Vascular: Calcified atherosclerosis at the skull base. No suspicious intracranial vascular hyperdensity. Skull: Chronic appearing periapical lucency at the posterior right maxilla, likely dental related. No acute osseous abnormality identified. Sinuses/Orbits: Paranasal sinuses, tympanic cavities and mastoids are clear. Other: Slight rightward gaze. Otherwise negative orbit and scalp soft tissues. ASPECTS [facility] Stroke Program Early CT Score) Total score (0-10 with 10 being normal): 10 IMPRESSION: 1. Acute lacunar infarct suspected at the lateral left thalamus. No associated hemorrhage or mass effect. 2. Elsewhere stable and negative noncontrast CT appearance of the brain. ASPECTS 10. 3. These results were communicated to Dr. Lorraine Lax at 12:23 am on 01/30/2020 by text page via the [facility] messaging system. Electronically Signed   By: Genevie Ann M.D.   On: 01/30/2020 00:24     Labs:   Basic Metabolic Panel: Recent Labs  Lab 02/01/20 0313 02/01/20 0313 02/02/20 0155 02/05/20 0801  NA  --   --  135 133*  K 4.4   < > 4.2 4.4  CL  --   --  103 101  CO2  --   --  22 23  GLUCOSE  --   --  187* 122*  BUN  --   --  15 12  CREATININE  --   --  0.81 0.72  CALCIUM  --   --  9.2 9.3  MG 2.0  --   --   --    < > = values in this interval not displayed.   GFR Estimated Creatinine Clearance: 137.8 mL/min (by C-G formula based on SCr of 0.72 mg/dL). Liver Function Tests: No results for input(s): AST, ALT, ALKPHOS, BILITOT, PROT, ALBUMIN in the last 168 hours. No results for input(s): LIPASE, AMYLASE in the last 168 hours. No results for input(s): AMMONIA in the last 168 hours. Coagulation profile No results for input(s): INR, PROTIME in the last 168 hours.  CBC: Recent Labs  Lab 02/02/20 0155 02/05/20 0801  WBC 9.7 10.8*  NEUTROABS 6.2 7.0   HGB 16.1 16.5  HCT 46.1 47.8  MCV 89.5 88.2  PLT 216 271   Cardiac Enzymes: No results for input(s): CKTOTAL, CKMB, CKMBINDEX, TROPONINI in the last 168 hours. BNP: Invalid input(s): POCBNP CBG: Recent Labs  Lab 02/05/20 1136 02/05/20 1621 02/05/20 2147 02/06/20 0638 02/06/20 1131  GLUCAP 121* 161* 140* 123* 156*   D-Dimer No results for input(s): DDIMER in the last 72 hours. Hgb A1c No results for input(s): HGBA1C in the last 72 hours. Lipid Profile No results for input(s): CHOL, HDL, LDLCALC, TRIG, CHOLHDL, LDLDIRECT in the last 72 hours. Thyroid function studies No results for input(s): TSH, T4TOTAL, T3FREE, THYROIDAB in the last 72 hours.  Invalid input(s): FREET3 Anemia work up No results for input(s): VITAMINB12, FOLATE, FERRITIN, TIBC, IRON, RETICCTPCT in the last 72 hours. Microbiology Recent Results (from the past 240 hour(s))  SARS CORONAVIRUS 2 (TAT 6-24 HRS) Nasopharyngeal Nasopharyngeal Swab     Status: None   Collection Time: 01/30/20  1:08 AM   Specimen: Nasopharyngeal Swab  Result Value Ref Range Status   SARS Coronavirus 2 NEGATIVE NEGATIVE Final    Comment: (NOTE) SARS-CoV-2 target nucleic acids are NOT DETECTED.  The SARS-CoV-2 RNA is generally detectable in upper and lower respiratory specimens during the acute phase of infection. Negative results do not preclude SARS-CoV-2 infection, do not rule out co-infections with other pathogens, and should not be used as the sole  basis for treatment or other patient management decisions. Negative results must be combined with clinical observations, patient history, and epidemiological information. The expected result is Negative.  Fact Sheet for Patients: SugarRoll.be  Fact Sheet for Healthcare Providers: https://www.woods-mathews.com/  This test is not yet approved or cleared by the [place] FDA and  has been authorized for detection and/or diagnosis of  SARS-CoV-2 by FDA under an Emergency Use Authorization (EUA). This EUA will remain  in effect (meaning this test can be used) for the duration of the COVID-19 declaration under Se ction 564(b)(1) of the Act, 21 U.S.C. section 360bbb-3(b)(1), unless the authorization is terminated or revoked sooner.  Performed at [facility], [place] [address] [place],  [address]      Signed: Terrilee Croak  [facility] 02/06/2020, 1:34 PM

## 2020-02-06 NOTE — Progress Notes (Signed)
SLP Cancellation Note  Patient Details Name: Peter Tran MRN: [id number] DOB: Feb 02, 1971   Cancelled treatment:       Reason Eval/Treat Not Completed: Other (comment) (Patient seen briefly by SLP and is no longer exhibiting any significant s/s of dysarthria even at conversational level.) He will be discharged from speech therapy at this time.   Angela Nevin, MA, CCC-SLP Speech Therapy [facility] Acute Rehab Pager: [phone]

## 2020-02-06 NOTE — Progress Notes (Signed)
Pt has been discharged unit via wheelchair. AVS has been given and reviewed. Tele has been discontinued. All belongings sent with the patient. Pt denies any pain. Pt sent in good spirits.

## 2020-02-06 NOTE — Progress Notes (Signed)
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  Permissive HTN of 220/120 per neuro.  PMH of DM and HTN.    PT Comments    Patient eager to get more therapy, so he can get better and return to work. Despite improvements, he scores a 44/56 on Berg Balance Assessment. He continues to demonstrate RLE weakness with only able to complete 5 step ups/lower down with RLE in a row and needs rest due to unable to raise his body weight with RLE. Without use of rails, pt had one loss of balance on the stairs requiring min assist.   Note: entered late. Patient seen prior to [facility] admission coordinator stated he is too high-level for CIR. Notified her of pt's score of 44/56 on Berg and continued RLE weakness, however MD has stated he is too high level and signed off. Discussed with Case Manager and pt's insurance Counselling psychologist) arranges follow-up PT for their patients. PT recommending HHPT.    Follow Up Recommendations  CIR (has been denied for CIR; recommend HHPT)     Equipment Recommendations  None recommended by PT    Recommendations for Other Services       Precautions / Restrictions Precautions Precautions: Fall Precaution Comments: R sided numbness    Mobility  Bed Mobility Overal bed mobility: Modified Independent Bed Mobility: Supine to Sit     Supine to sit: Modified independent (Device/Increase time)     General bed mobility comments: sat EOB and donned socks with supervision (incr dyspnea); donned shoes with supervision and PT tied laces due to dyspnea  Transfers Overall transfer level: Needs assistance Equipment used: None Transfers: Sit to/from Stand Sit to Stand: Supervision         General transfer comment: very difficult from bed without use of UEs (required multiple  attempts)  Ambulation/Gait Ambulation/Gait assistance: Min guard Gait Distance (Feet): 150 Feet (standing rest after stairs; 150 ft) Assistive device: None Gait Pattern/deviations: Step-through pattern;Decreased stride length Gait velocity: decreased   General Gait Details: continued decr RUE swing and trunk rotation during gait; able to maintain straight path with head turns and nods   Stairs   Stairs assistance: Min guard Stair Management: Forwards;No rails;Alternating pattern Number of Stairs: 5 (plus single step up RLE) General stair comments: LOB x 1 with min assist to recover (not using rail)   Wheelchair Mobility    Modified Rankin (Stroke Patients Only) Modified Rankin (Stroke Patients Only) Pre-Morbid Rankin Score: No symptoms Modified Rankin: Moderately severe disability     Balance Overall balance assessment: Needs assistance Sitting-balance support: Feet supported Sitting balance-Leahy Scale: Normal     Standing balance support: No upper extremity supported Standing balance-Leahy Scale: Good   Single Leg Stance - Right Leg: 11 Single Leg Stance - Left Leg: 30     Rhomberg - Eyes Opened: 30 Rhomberg - Eyes Closed: 0 (30 sec with supervision with incr sway)   High Level Balance Comments: multiple reps of SLS, primarily on RLE with progressively less UE support at counter (to no UE support); see Berg Standardized Balance Assessment Standardized Balance Assessment : Berg Balance Test Berg Balance Test Sit to Stand: Able to stand  independently using hands Standing Unsupported: Able to stand safely 2 minutes Sitting with Back Unsupported but Feet Supported on Floor or Stool: Able to sit  safely and securely 2 minutes Stand to Sit: Controls descent by using hands Transfers: Able to transfer safely, minor use of hands Standing Unsupported with Eyes Closed: Able to stand 10 seconds with supervision Standing Ubsupported with Feet Together: Able to place feet  together independently and stand 1 minute safely From Standing, Reach Forward with Outstretched Arm: Can reach confidently >25 cm (10") From Standing Position, Pick up Object from Floor: Able to pick up shoe safely and easily From Standing Position, Turn to Look Behind Over each Shoulder: Looks behind one side only/other side shows less weight shift Turn 360 Degrees: Able to turn 360 degrees safely but slowly Standing Unsupported, Alternately Place Feet on Step/Stool: Able to complete 4 steps without aid or supervision Standing Unsupported, One Foot in Front: Able to plae foot ahead of the other independently and hold 30 seconds Standing on One Leg: Tries to lift leg/unable to hold 3 seconds but remains standing independently Total Score: 44        Cognition Arousal/Alertness: Awake/alert Behavior During Therapy: WFL for tasks assessed/performed Overall Cognitive Status: Within Functional Limits for tasks assessed                                 General Comments: Walking an appropriate speed; able to avoid obstacles; difficulty staying on cognitive task assigned while in busy hallway      Exercises Other Exercises Other Exercises: RLE forward step ups/lower down 7" step x 5 reps x 2 Other Exercises: RLE lateral step up/lower down x 5 reps x 2  RLE heel raises with bil UE support x 10 reps (last 4 with lesser elevation of heel and incr reliance on UEs)    General Comments        Pertinent Vitals/Pain Pain Assessment: No/denies pain (reports no foot pain; intermittent tingling rt hand)    Home Living                      Prior Function            PT Goals (current goals can now be found in the care plan section) Acute Rehab PT Goals Patient Stated Goal: to get to rehab, and eventually get back to work Time For Goal Achievement: 02/13/20 Potential to Achieve Goals: Good Progress towards PT goals: Progressing toward goals    Frequency    Min  4X/week      PT Plan Discharge plan needs to be updated;Current plan remains appropriate    Co-evaluation              AM-PAC PT "6 Clicks" Mobility   Outcome Measure  Help needed turning from your back to your side while in a flat bed without using bedrails?: None Help needed moving from lying on your back to sitting on the side of a flat bed without using bedrails?: None Help needed moving to and from a bed to a chair (including a wheelchair)?: A Little Help needed standing up from a chair using your arms (e.g., wheelchair or bedside chair)?: None Help needed to walk in hospital room?: A Little Help needed climbing 3-5 steps with a railing? : A Little 6 Click Score: 21    End of Session Equipment Utilized During Treatment: Gait belt Activity Tolerance: Patient limited by fatigue (muscular and cardiopulmonary endurance) Patient left: with call bell/phone within reach;in chair Nurse Communication: Mobility status;Other (comment) (pt remains fall risk and  educated to call for assist) PT Visit Diagnosis: Other symptoms and signs involving the nervous system (R29.898);Other abnormalities of gait and mobility (R26.89);Muscle weakness (generalized) (M62.81);Hemiplegia and hemiparesis Hemiplegia - Right/Left: Right Hemiplegia - dominant/non-dominant: Dominant Hemiplegia - caused by: Cerebral infarction     Time: 2536-6440 PT Time Calculation (min) (ACUTE ONLY): 41 min  Charges:  $Gait Training: 8-22 mins $Therapeutic Activity: 23-37 mins                      Jerolyn Center, PT Pager [phone]    Zena Amos 02/06/2020, 1:37 PM

## 2020-02-06 NOTE — Progress Notes (Signed)
Inpatient Rehab Admissions Coordinator:   Note pt ambulating 450' with no device and supervision over the weekend.  Pt mobilizing too well for CIR at this point, will sign off.  I let pt and TOC team know.   Estill Dooms, PT, DPT Admissions Coordinator [phone] 02/06/20  11:51 AM

## 2020-02-06 NOTE — TOC Transition Note (Signed)
Transition of Care [facility]) - CM/SW Discharge Note   Patient Details  Name: Peter Tran MRN: [id number] Date of Birth: 10/09/1970  Transition of [facility]) CM/SW Contact:  Kermit Balo, RN Phone Number: 02/06/2020, 2:39 PM   Clinical Narrative:    Pt discharging home with [facility] services that will be arranged through [employer]). CM has faxed [facility] the information and orders needed.  No DME needs.  Pt has transportation home and states his brother is arranging him a PCP appt with [place] at [facility].     Final next level of care: Home w Home Health Services Barriers to Discharge: No Barriers Identified   Patient Goals and CMS Choice     Choice offered to / list presented to : Patient  Discharge Placement                       Discharge Plan and Services   Discharge Planning Services: CM Consult Post Acute Care Choice: IP Rehab                    HH Arranged: PT, OT, Speech Therapy HH Agency:  ([facility] through [place] will arrange. Orders faxed to [facility])        Social Determinants of Health (SDOH) Interventions     Readmission Risk Interventions No flowsheet data found.

## 2020-02-07 NOTE — Progress Notes (Signed)
02/07/20 1211 pm: [facility] unable to arrange [facility] services. CM spoke to [facility] with [place] and he is able to staff the [facility]. He will start with ST, OT and add PT later as staffing allows. CM will update the patient.

## 2020-02-09 ENCOUNTER — Telehealth: Payer: Self-pay | Admitting: *Deleted

## 2020-02-09 ENCOUNTER — Encounter: Payer: Self-pay | Admitting: *Deleted

## 2020-02-09 NOTE — Telephone Encounter (Signed)
Patient enrolled for [employer] to ship a 14 day ZIO AT long term monitor Live Telemetry to his home.  Briefly reviewed instructions with patient. Address corrected in demographics. Letter with instructions mailed to home.

## 2020-02-09 NOTE — Progress Notes (Signed)
Patient ID: Peter Tran, male   DOB: [birth year], 49 y.o.   MRN: [id number] Patient enrolled for [employer] to ship a 14 day ZIO AT long term monitor-Live Telemetry to his home.   Letter with instructions mailed to patient.

## 2020-02-14 ENCOUNTER — Encounter (INDEPENDENT_AMBULATORY_CARE_PROVIDER_SITE_OTHER): Payer: Managed Care, Other (non HMO)

## 2020-02-14 DIAGNOSIS — I639 Cerebral infarction, unspecified: Secondary | ICD-10-CM

## 2020-02-14 DIAGNOSIS — I456 Pre-excitation syndrome: Secondary | ICD-10-CM | POA: Diagnosis not present

## 2020-03-05 ENCOUNTER — Other Ambulatory Visit: Payer: Self-pay | Admitting: Medical

## 2020-03-05 ENCOUNTER — Other Ambulatory Visit: Payer: Self-pay | Admitting: *Deleted

## 2020-03-05 DIAGNOSIS — I456 Pre-excitation syndrome: Secondary | ICD-10-CM

## 2020-03-05 DIAGNOSIS — I639 Cerebral infarction, unspecified: Secondary | ICD-10-CM

## 2020-03-06 ENCOUNTER — Inpatient Hospital Stay: Payer: Self-pay | Admitting: Adult Health

## 2020-03-15 ENCOUNTER — Telehealth: Payer: Self-pay | Admitting: Cardiology

## 2020-03-15 NOTE — Telephone Encounter (Signed)
Patient returning call for monitor results. 

## 2020-03-15 NOTE — Telephone Encounter (Signed)
Advised of results and scheduled follow up visit with Dr Antoine Poche     Rollene Rotunda, MD  03/11/2020 9:40 PM EDT     No evidence of sustained arrhythmias. He does have short runs of SVT. Please call with results and schedule a follow up with me

## 2020-03-28 NOTE — Progress Notes (Signed)
Cardiology Office Note   Date:  03/29/2020   ID:  XUE LOW, DOB [birth year], MRN [id number]  PCP:  Patient, No Pcp Per  Cardiologist:   Minus Breeding, MD   Chief Complaint  Patient presents with  . Cerebrovascular Accident      History of Present Illness: Peter Tran is a 49 y.o. male who follows up for evaluation of left acute ischemic lateral thalamus stroke.  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  He had no evidence for an embolic source for his CVA.  Echo was unremarkable.   He did have hypertensive urgency.  He had poorly controlled DM.  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    He had no sustained arrhythmias.  He did have short runs of SVT. The longest run was 12 beats with narrow complex.   He has been working with physical therapy.  He has residual decreased taste.  He has tingling.  He has is in his upper and lower extremities.  Has had some weakness.  He has not noticed any palpitations, presyncope or syncope.  Has had no chest pressure, neck or arm discomfort.  Has had no weight gain or edema.  He is seeing a physician at [facility] on is having finally is diabetes and blood pressure well addressed although he did run out of medicines at one point in time.  He is back on the meds as listed below.   Past Medical History:  Diagnosis Date  . CVA (cerebral vascular accident) ([place])   . Diabetes mellitus without complication ([place])   . Dyslipidemia   . Hypertension     Past Surgical History:  Procedure Laterality Date  . HAND SURGERY Right      Current Outpatient Medications  Medication Sig Dispense Refill  . amLODipine (NORVASC) 10 MG tablet Take 1 tablet (10 mg total) by mouth daily. 30 tablet 0  . aspirin EC 81 MG EC tablet Take 1 tablet (81 mg total) by mouth daily. Swallow whole. 30 tablet 0  . atorvastatin (LIPITOR) 80 MG tablet Take 1 tablet (80 mg total) by mouth daily. 30 tablet 0  . blood glucose meter kit  and supplies Dispense based on patient and insurance preference. Use up to two times daily as directed. (FOR ICD-10 E10.9, E11.9). 1 each 0  . hydrALAZINE (APRESOLINE) 25 MG tablet Take 25 mg by mouth 2 (two) times daily.    . insulin detemir (LEVEMIR) 100 UNIT/ML FlexPen Inject 25 Units into the skin at bedtime. 7.5 mL 0  . lisinopril-hydrochlorothiazide (ZESTORETIC) 20-25 MG tablet Take 1 tablet by mouth daily.    . metFORMIN (GLUCOPHAGE) 1000 MG tablet Take 1 tablet (1,000 mg total) by mouth 2 (two) times daily with a meal. 60 tablet 0   No current facility-administered medications for this visit.    Allergies:   Patient has no known allergies.    ROS:  Please see the history of present illness.   Otherwise, review of systems are positive for none.   All other systems are reviewed and negative.    PHYSICAL EXAM: VS:  BP (!) 145/89   Pulse 80   Ht 5' 9"  (1.753 m)   Wt 252 lb (114.3 kg)   SpO2 97%   BMI 37.21 kg/m  , BMI Body mass index is 37.21 kg/m. GENERAL:  Well appearing NECK:  No jugular venous distention, waveform within normal limits, carotid upstroke  brisk and symmetric, no bruits, no thyromegaly LUNGS:  Clear to auscultation bilaterally BACK:  No CVA tenderness CHEST:  Unremarkable HEART:  PMI not displaced or sustained,S1 and S2 within normal limits, no S3, no S4, no clicks, no rubs, no murmurs ABD:  Flat, positive bowel sounds normal in frequency in pitch, no bruits, no rebound, no guarding, no midline pulsatile mass, no hepatomegaly, no splenomegaly EXT:  2 plus pulses throughout, no edema, no cyanosis no clubbing   EKG:  EKG is not ordered today.    Recent Labs: 01/30/2020: ALT 40 02/01/2020: Magnesium 2.0 02/05/2020: BUN 12; Creatinine, Ser 0.72; Hemoglobin 16.5; Platelets 271; Potassium 4.4; Sodium 133    Lipid Panel    Component Value Date/Time   CHOL 295 (H) 01/30/2020 0559   TRIG 164 (H) 01/30/2020 0559   HDL 65 01/30/2020 0559   CHOLHDL 4.5  01/30/2020 0559   VLDL 33 01/30/2020 0559   LDLCALC 197 (H) 01/30/2020 0559      Wt Readings from Last 3 Encounters:  03/29/20 252 lb (114.3 kg)  01/30/20 260 lb (117.9 kg)      Other studies Reviewed: Additional studies/ records that were reviewed today include: Labs. Review of the above records demonstrates:  Please see elsewhere in the note.     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  At this point I think he is not having any symptoms and no change in therapy is indicated.  DM: He is going to have his A1c checked by his primary provider I will defer to their management.  CVA: I will wait to do the POET (Plain Old Exercise Treadmill) as above for about 3 months until he is recovered a little bit.  HTN: His blood pressure slightly elevated but he just had his medications adjusted.  No change in therapy.      Current medicines are reviewed at length with the patient today.  The patient does not have concerns regarding medicines.  The following changes have been made:  no change  Labs/ tests ordered today include: None No orders of the defined types were placed in this encounter.    Disposition:   FU with me in 6 months.      Signed, Minus Breeding, MD  03/29/2020 5:22 PM    [facility]

## 2020-03-29 ENCOUNTER — Other Ambulatory Visit: Payer: Self-pay

## 2020-03-29 ENCOUNTER — Ambulatory Visit (INDEPENDENT_AMBULATORY_CARE_PROVIDER_SITE_OTHER): Payer: Managed Care, Other (non HMO) | Admitting: Cardiology

## 2020-03-29 ENCOUNTER — Encounter: Payer: Self-pay | Admitting: Cardiology

## 2020-03-29 VITALS — BP 145/89 | HR 80 | Ht 69.0 in | Wt 252.0 lb

## 2020-03-29 DIAGNOSIS — I639 Cerebral infarction, unspecified: Secondary | ICD-10-CM | POA: Diagnosis not present

## 2020-03-29 DIAGNOSIS — I456 Pre-excitation syndrome: Secondary | ICD-10-CM | POA: Diagnosis not present

## 2020-03-29 DIAGNOSIS — I10 Essential (primary) hypertension: Secondary | ICD-10-CM | POA: Diagnosis not present

## 2020-03-29 MED ORDER — INSULIN DETEMIR 100 UNIT/ML FLEXPEN: 25.0000 [IU] | PEN_INJECTOR | Freq: Every day | SUBCUTANEOUS | 0 refills | Status: AC

## 2020-03-29 NOTE — Addendum Note (Signed)
Addended by: Dorris Fetch on: 03/29/2020 05:34 PM   Modules accepted: Orders

## 2020-03-29 NOTE — Patient Instructions (Signed)
Medication Instructions:  No changes  *If you need a refill on your cardiac medications before your next appointment, please call your pharmacy*   Lab Work: No Labs If you have labs (blood work) drawn today and your tests are completely normal, you will receive your results only by: Marland Kitchen MyChart Message (if you have MyChart) OR . A paper copy in the mail If you have any lab test that is abnormal or we need to change your treatment, we will call you to review the results.   Testing/Procedures: Your physician has requested that you have an exercise tolerance test. For further information please visit [email or web address] Please also follow instruction sheet, as given. [address], [address]    Follow-Up: At [facility], you and your health needs are our priority.  As part of our continuing mission to provide you with exceptional heart care, we have created designated Provider Care Teams.  These Care Teams include your primary Cardiologist (physician) and Advanced Practice Providers (APPs -  Physician Assistants and Nurse Practitioners) who all work together to provide you with the care you need, when you need it.  We recommend signing up for the patient portal called "MyChart".  Sign up information is provided on this After Visit Summary.  MyChart is used to connect with patients for Virtual Visits (Telemedicine).  Patients are able to view lab/test results, encounter notes, upcoming appointments, etc.  Non-urgent messages can be sent to your provider as well.   To learn more about what you can do with MyChart, go to [email or web address].    Your next appointment:   5 month(s)  The format for your next appointment:   In Person  Provider:   Rollene Rotunda, MD

## 2020-04-03 ENCOUNTER — Encounter: Payer: Self-pay | Admitting: Adult Health

## 2020-04-03 ENCOUNTER — Ambulatory Visit (INDEPENDENT_AMBULATORY_CARE_PROVIDER_SITE_OTHER): Payer: Managed Care, Other (non HMO) | Admitting: Adult Health

## 2020-04-03 ENCOUNTER — Other Ambulatory Visit: Payer: Self-pay

## 2020-04-03 VITALS — BP 150/93 | HR 114 | Ht 69.0 in | Wt 250.0 lb

## 2020-04-03 DIAGNOSIS — I10 Essential (primary) hypertension: Secondary | ICD-10-CM | POA: Diagnosis not present

## 2020-04-03 DIAGNOSIS — E785 Hyperlipidemia, unspecified: Secondary | ICD-10-CM | POA: Diagnosis not present

## 2020-04-03 DIAGNOSIS — I639 Cerebral infarction, unspecified: Secondary | ICD-10-CM

## 2020-04-03 DIAGNOSIS — I6381 Other cerebral infarction due to occlusion or stenosis of small artery: Secondary | ICD-10-CM

## 2020-04-03 DIAGNOSIS — E1165 Type 2 diabetes mellitus with hyperglycemia: Secondary | ICD-10-CM

## 2020-04-03 NOTE — Progress Notes (Signed)
Peter Tran is a 49 y.o. male here for a f/u from the hospital. Pt says he still has right side numbness and everything taste like plastic. Pt also complains of  his head feels like its sunburn

## 2020-04-03 NOTE — Patient Instructions (Signed)
Start outpatient therapy - may be beneficial to do neuro rehab physical and occupation therapy - if orders are needed, please call office  Continue aspirin 81 mg daily  and atorvastatin 80mg  daily  for secondary stroke prevention  Continue to follow up with PCP regarding cholesterol, blood pressure and diabetes management  Maintain strict control of hypertension with blood pressure goal below 130/90, diabetes with hemoglobin A1c goal below 7% and cholesterol with LDL cholesterol (bad cholesterol) goal below 70 mg/dL.      Followup in the future with me in 3 months or call earlier if needed       Thank you for coming to see at [facility]. I hope we have been able to provide you high quality care today.  You may receive a patient satisfaction survey over the next few weeks. We would appreciate your feedback and comments so that we may continue to improve ourselves and the health of our patients.   Sensory Loss After a Stroke A stroke can damage parts of your brain that control your body's normal functions, including your senses. As a result, you may have sensory loss, such as trouble seeing, tasting, swallowing, or feeling touch or pressure sensations. You may have problems feeling temperature changes or moving your body in a coordinated way. You may also perceive smell differently. You may have problems with all of your senses or only some of them. By following a treatment plan, you may recover lost senses and manage the changes to your lifestyle. What are treatment therapies for sensory loss? You may have a combination of therapies for sensory loss.  Physical therapy. This may include: ? Exercises to improve your coordination and balance. ? Exercises that combine touch, balance, and movement (sensorimotor training). ? Movements to relieve pressure while you are sitting or lying (mobility training). ? Splints or braces to protect parts of your body that you  cannot feel.  Devices to help with blood flow (circulation) and to help stimulate nerves in affected parts of your body.  Speech therapy to help you swallow safely.  Occupational therapy to help you with everyday tasks. This may include: ? Exercises or devices to improve your vision. ? Exercises to help you increase your touch perception. These may include feeling objects of different sizes and textures while your eyes are closed. ? Techniques for moving safely in your environment.  Prescription eye glasses for vision loss.  Hearing aids for hearing loss. Follow these instructions at home: Safety   Your risk of falling is higher after a stroke. You may have difficulty feeling your legs and feet or coordinating your movements. To lower your risk of falling: ? Use devices to help you move around (assistive devices), such as a wheelchair or walker, as directed by your health care team. ? Wear prescription eye glasses at all times when moving around. ? Use lights to help you see in the dark. ? Use grab bars in bathrooms and handrails in stairways. ? Keep walkways clear in your home by removing rugs, cords, and clutter from the floor.  Your risk of getting burned is higher after a stroke. To lower your risk of burns: ? Test the water temperature before taking a bath or washing your hands. ? Allow hot foods to cool slightly before eating. ? Use potholders when handling hot pans.  When using sharp objects, such as scissors or knives, use your healthy hand. Do not handle sharp objects with your hand that was affected  by your stroke, if this applies. Activity  Return to your normal activities as told by your health care provider. Ask your health care provider what activities are safe for you.  Avoid spending too much time sitting or lying down. If you must be in a chair or bed, change positions regularly.  You may have problems doing your normal activities. Ask your health care provider  about getting extra help at home. Eating and drinking   You may have problems swallowing food and fluids after a stroke. The problems can be due to: ? Changes in your muscles. ? Sensory changes, such as:  Difficulty feeling the consistency or size of a piece of food in your mouth.  Inability to feel the need to clear your throat.  You may need to: ? Take smaller bites and chew thoroughly. Make sure you have swallowed all the food in your mouth before you take another bite. ? Sit in an upright position when eating or drinking. ? Avoid distractions while eating or drinking. ? Stay upright for 30-45 minutes after eating. ? Change the texture of some things that you eat and drink. This may include:  Changing foods to a smooth, mashed consistency (puree).  Thickening liquids.  Follow instructions from your health care provider about eating and drinking restrictions. General instructions  Wear arm or leg braces as told by your health care team.  Get help at home as needed. You may need help getting dressed, bathing, using the bathroom, eating, or doing other activities.  Keep all follow-up visits as told by your health care providers. This is important. Summary  It is common to have sensory loss after a stroke. Sensory loss means that you have problems with some or all of your senses, such as vision, taste, hearing, smell, and touch.  Sensory loss happens because of damage to your brain and nervous system after a stroke.  Treatment for sensory loss may include physical, occupational, or speech therapy, and the use of assistive devices.  You may need to make changes to your home and lifestyle after a stroke to help you live safely and independently. This information is not intended to replace advice given to you by your health care provider. Make sure you discuss any questions you have with your health care provider. Document Revised: 10/01/2018 Document Reviewed:  09/26/2016 Elsevier Patient Education  2020 [employer].

## 2020-04-03 NOTE — Progress Notes (Signed)
[facility] [address] [place] [address] [phone]       HOSPITAL FOLLOW UP NOTE  [name] Date of Birth:  19-May-1971 Medical Record Number:  [id number]   Reason for Referral:  hospital stroke follow up    SUBJECTIVE:   CHIEF COMPLAINT:  Chief Complaint  Patient presents with   Hospitalization Follow-up    Accompanied by sister in law   Cerebrovascular Accident    see attached note    HPI:   Peter Tran a 49 y.o.malepast medical history significant for hypertension, diabetes mellitus not on any medications who presented on 01/30/2020 to the emergency department as a code stroke for right-sided numbness difficulty getting words out.  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  Recommended DAPT for 3 weeks and aspirin alone.  LDL 197 and initiate atorvastatin 80 mg daily.  Uncontrolled DM with A1c 11.0.  Uncontrolled HTN with SBP 160s to 190s during admission.  Other stroke risk factors include multifocal atherosclerosis as seen on CTA head/neck, tobacco use, EtOH use and obesity but no prior stroke history.  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  Code Stroke CTH 1. Acute lacunar infarct suspected at the lateral left thalamus. No associated hemorrhage or mass effect. 2. Elsewhere stable and negative noncontrast CT appearance of thebrain. ASPECTS 10  CTA head & neck 1. No emergent large vessel occlusion. 2. Moderate stenosis of the lacerum segment of the left internal carotid artery secondary to noncalcified plaque. 3. Mild stenosis of the left vertebral artery V4 segment.  MRI Small acute infarct of the anterior lateral abdominals.  No acute abnormality managing  2D Echo EF 55 to 60%.   LDL 197  HgbA1c  11.0  VTE prophylaxis - lovenox  No antithrombotic prior to admission, now on aspirin 81 mg daily.  Initiated aspirin and Plavix for 3 weeks and then aspirin alone  Therapy recommendations:  [place] therapy  Disposition:  home   Today, 04/03/2020, for hospital follow-up accompanied by his sister in law.  Reports right-sided numbness/tingling with paresthesias and altered sensation, incoordination, altered taste, imbalance/vertigo and fatigue.  He denies residual speech or language deficits.  Recently completed [place] therapy and is scheduled to start outpatient OT in [facility] on 10/19.  He continues to do exercises at home but is frustrated as he fatigues quickly.  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  He lives alone and able to maintain ADLs independently but needs assistance with IADLs and has not returned back to driving.  Denies new or worsening stroke/TIA symptoms.  Completed 3 weeks DAPT and remains on aspirin alone without bleeding or bruising.  Remains on atorvastatin 80 mg daily without myalgias.  Blood pressure today 150/93 and continues to work closely with PCP for uncontrolled HTN.  Monitors at home and typically SBP range 160s-180s.  Glucose levels monitored at home which has been stable and has made dietary changes and reports approximately 15 pound weight loss since hospitalization.  Continued tobacco use but does report decreasing daily amount since discharge.  No further concerns at this time.       ROS:   14 system review of systems performed and negative with exception of those listed in HPI  PMH:  Past Medical History:  Diagnosis Date   CVA (cerebral vascular accident) ([place])    Diabetes mellitus without complication ([place])    Dyslipidemia    Hypertension     PSH:  Past Surgical History:  Procedure Laterality Date   HAND SURGERY Right     Social History:  Social History   Socioeconomic History   Marital  status: Married    Spouse name: Not on file   Number of children: Not on file   Years of education: Not on file   Highest education level: Not on file  Occupational History   Occupation: Ecologist, Animal nutritionist location    Employer: [employer]  Tobacco Use   Smoking status: Current Every Day Smoker   Smokeless tobacco: Never Used  Substance and Sexual Activity   Alcohol use: Yes    Comment: couple of beers/daily   Drug use: Never   Sexual activity: Not on file  Other Topics Concern   Not on file  Social History Narrative   Lives alone.  13 year old daughter.     Social Determinants of Health   Financial Resource Strain:    Difficulty of Paying Living Expenses: Not on file  Food Insecurity:    Worried About Charity fundraiser in the Last Year: Not on file   [employer] of Food in the Last Year: Not on file  Transportation Needs:    Lack of Transportation (Medical): Not on file   Lack of Transportation (Non-Medical): Not on file  Physical Activity:    Days of Exercise per Week: Not on file   Minutes of Exercise per Session: Not on file  Stress:    Feeling of Stress : Not on file  Social Connections:    Frequency of Communication with Friends and Family: Not on file   Frequency of Social Gatherings with Friends and Family: Not on file   Attends Religious Services: Not on file   Active Member of Clubs or Organizations: Not on file   Attends Archivist Meetings: Not on file   Marital Status: Not on file  Intimate Partner Violence:    Fear of Current or Ex-Partner: Not on file   Emotionally Abused: Not on file   Physically Abused: Not on file   Sexually Abused: Not on file    Family History:  Family History  Problem Relation Age of Onset   Hypertension Father    Cancer Father 37       brain   CAD Father        Stents   Cancer Mother     Medications:   Current Outpatient Medications on File Prior to Visit    Medication Sig Dispense Refill   blood glucose meter kit and supplies Dispense based on patient and insurance preference. Use up to two times daily as directed. (FOR ICD-10 E10.9, E11.9). 1 each 0   hydrALAZINE (APRESOLINE) 25 MG tablet Take 25 mg by mouth 2 (two) times daily.     insulin detemir (LEVEMIR) 100 UNIT/ML FlexPen Inject 25 Units into the skin at bedtime. 7.5 mL 0   lisinopril-hydrochlorothiazide (ZESTORETIC) 20-25 MG tablet Take 1 tablet by mouth daily.     amLODipine (NORVASC) 10 MG tablet Take 1 tablet (10 mg total) by mouth daily. 30 tablet 0   aspirin EC 81 MG EC tablet Take 1 tablet (81 mg total) by mouth daily. Swallow whole. 30 tablet 0   atorvastatin (LIPITOR) 80 MG tablet Take 1  tablet (80 mg total) by mouth daily. 30 tablet 0   metFORMIN (GLUCOPHAGE) 1000 MG tablet Take 1 tablet (1,000 mg total) by mouth 2 (two) times daily with a meal. 60 tablet 0   No current facility-administered medications on file prior to visit.    Allergies:  No Known Allergies    OBJECTIVE:  Physical Exam  Vitals:   04/03/20 0751  BP: (!) 150/93  Pulse: (!) 114  Weight: 250 lb (113.4 kg)  Height: 5' 9"  (1.753 m)   Body mass index is 36.92 kg/m. No exam data present  Post stroke PHQ 2/9 Depression screen PHQ 2/9 04/03/2020  Decreased Interest 0  Down, Depressed, Hopeless 0  PHQ - 2 Score 0     General: well developed, well nourished, pleasant middle-age Caucasian male, seated, in no evident distress Head: head normocephalic and atraumatic.   Neck: supple with no carotid or supraclavicular bruits Cardiovascular: regular rate and rhythm, no murmurs Musculoskeletal: no deformity Skin:  no rash/petichiae Vascular:  Normal pulses all extremities   Neurologic Exam Mental Status: Awake and fully alert.   Fluent speech and language.  Oriented to place and time. Recent and remote memory intact. Attention span, concentration and fund of knowledge appropriate. Mood and  affect appropriate.  Cranial Nerves: Fundoscopic exam reveals sharp disc margins. Pupils equal, briskly reactive to light. Extraocular movements full without nystagmus. Visual fields full to confrontation. Hearing intact. Facial sensation intact. Face, tongue, palate moves normally and symmetrically.  Motor: Normal bulk and tone. Normal strength in all tested extremity muscles. Sensory.:  Decreased light touch sensation right face, arm and leg Coordination: Rapid alternating movements normal in all extremities. Finger-to-nose and heel-to-shin right-sided ataxia Gait and Station: Arises from chair without difficulty. Stance is normal. Gait demonstrates normal stride length with some unsteadiness/imbalance greater with making turns.  Difficulty performing tandem walk.  Romberg positive Reflexes: 1+ and symmetric. Toes downgoing.     NIHSS  3 (decreased sensory and limb ataxia) Modified Rankin  2-3      ASSESSMENT: TASHI BAND is a 49 y.o. year old male presented with right-sided numbness and speech difficulty on 01/30/2020 with stroke work up revealing left thalamic infarct secondary to small vessel disease source. Vascular risk factors include uncontrolled HTN, DM and HLD as well as tobacco use.      PLAN:  1. L thalamic infarct :  a. Residual deficit: Right-sided ataxia with decreased sensory and paresthesias and imbalance/vertigo.  Currently awaiting to initiate OP OT in [facility].  Recommend participating at a neuro rehab facility for more specialized rehab needs poststroke.  He requests therapy place near [facility] as he is currently not driving.  Provided information for [facility] and advised patient to call office if he needs assistance with additional orders.  Would recommend participation with both PT and OT.  No indication for SLP at this time.  b. Continue aspirin 81 mg daily  and atorvastatin  for secondary stroke prevention.  c. Discussed secondary stroke prevention  measures and importance of close PCP follow up for aggressive stroke risk factor management  2. HTN: BP goal <130/90. On lisinopril and amlodipine per PCP 3. HLD: LDL goal <70. Recent LDL 197. Continue atorvastatin 26m daily. F/u with PCP for management as well as prescribing of statin 4. DMII: A1c goal<7.0. Recent A1c 11.0. F/u with PCP    Follow up in 3 months or call earlier if needed   I spent 45 minutes of face-to-face and  non-face-to-face time with patient and sister-in-law.  This included previsit chart review, lab review, study review, order entry, electronic health record documentation, patient education regarding recent stroke, residual deficits, importance of managing stroke risk factors and answered all questions to patient satisfaction    Frann Rider, [facility]  [facility] [address] [place], [place] [address]  Phone [phone] Fax [phone] Note: This document was prepared with digital dictation and possible smart phrase technology. Any transcriptional errors that result from this process are unintentional.

## 2020-04-04 ENCOUNTER — Encounter: Payer: Self-pay | Admitting: Adult Health

## 2020-04-04 NOTE — Progress Notes (Signed)
I agree with the above plan 

## 2020-07-12 ENCOUNTER — Encounter (HOSPITAL_COMMUNITY): Payer: Self-pay | Admitting: Cardiology

## 2020-07-16 ENCOUNTER — Ambulatory Visit: Payer: Managed Care, Other (non HMO) | Admitting: Adult Health

## 2020-07-26 ENCOUNTER — Telehealth (HOSPITAL_COMMUNITY): Payer: Self-pay | Admitting: Cardiology

## 2020-07-26 NOTE — Telephone Encounter (Signed)
Just an FYI. We have made several attempts to contact this patient including sending a letter to schedule or reschedule their echocardiogram. We will be removing the patient from the echo WQ.  07/12/20 MAILED LETTER [facility]  07/06/20 [facility] to schedule @ 1:57/[facility]  07/02/20 [facility] to schedule x 2 @ 1:30/[facility]W  06/28/20 [facility] to schedule @ 12pm/[facility]W  03-29-20 Patient did not want to schedule his testing.Marland KitchenMarland KitchenWould like to be called to be scheduled in 4 months. VB       Thank you

## 2020-07-26 NOTE — Telephone Encounter (Signed)
This was for a GXT, not an echocardiogram.

## 2021-06-28 ENCOUNTER — Encounter: Payer: Self-pay | Admitting: Cardiology

## 2022-04-21 IMAGING — CR DG CHEST 2V
2 series · 2 of 2 positions shown · non-contrast
Comparison: None.

CLINICAL DATA: Right-sided numbness

EXAM:
CHEST - 2 VIEW

[chest lat]
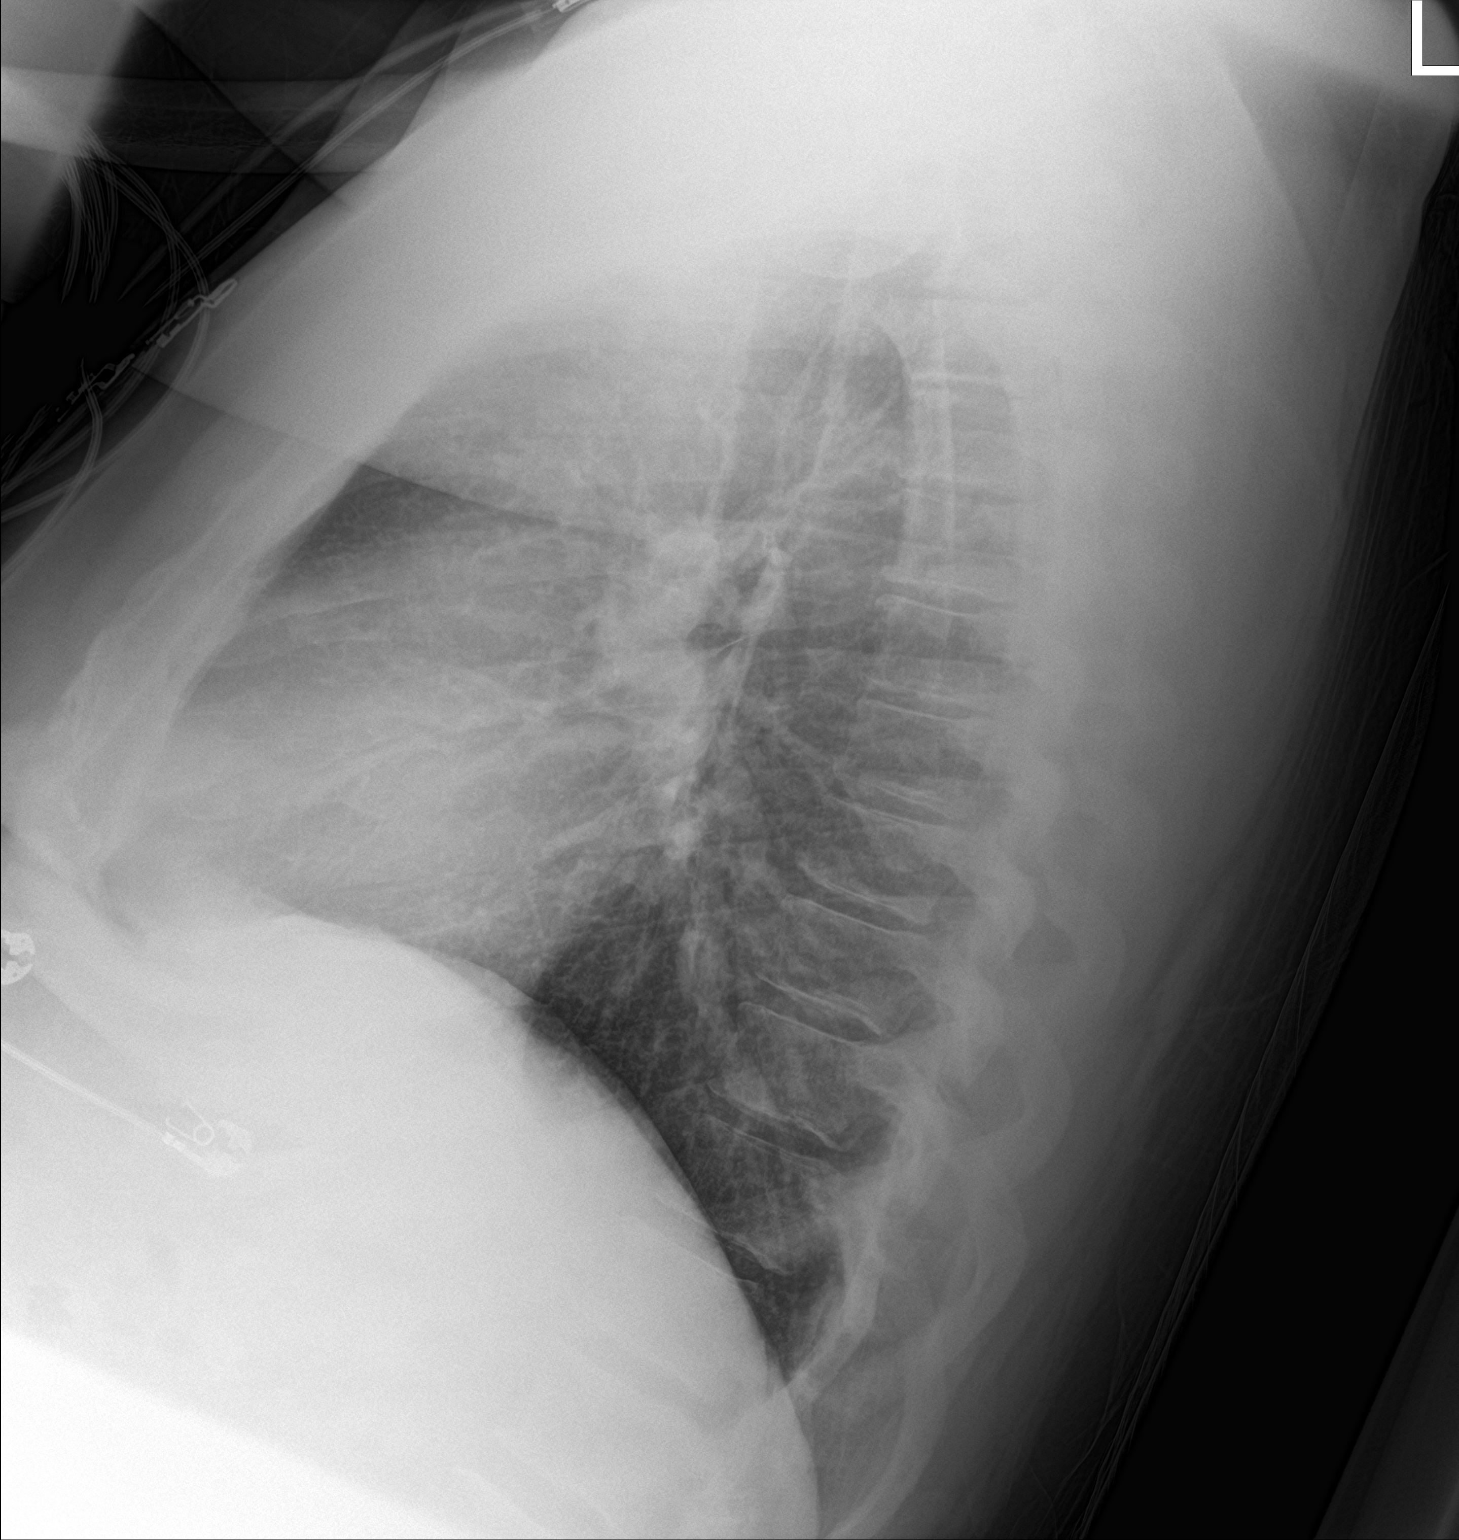

[chest ap]
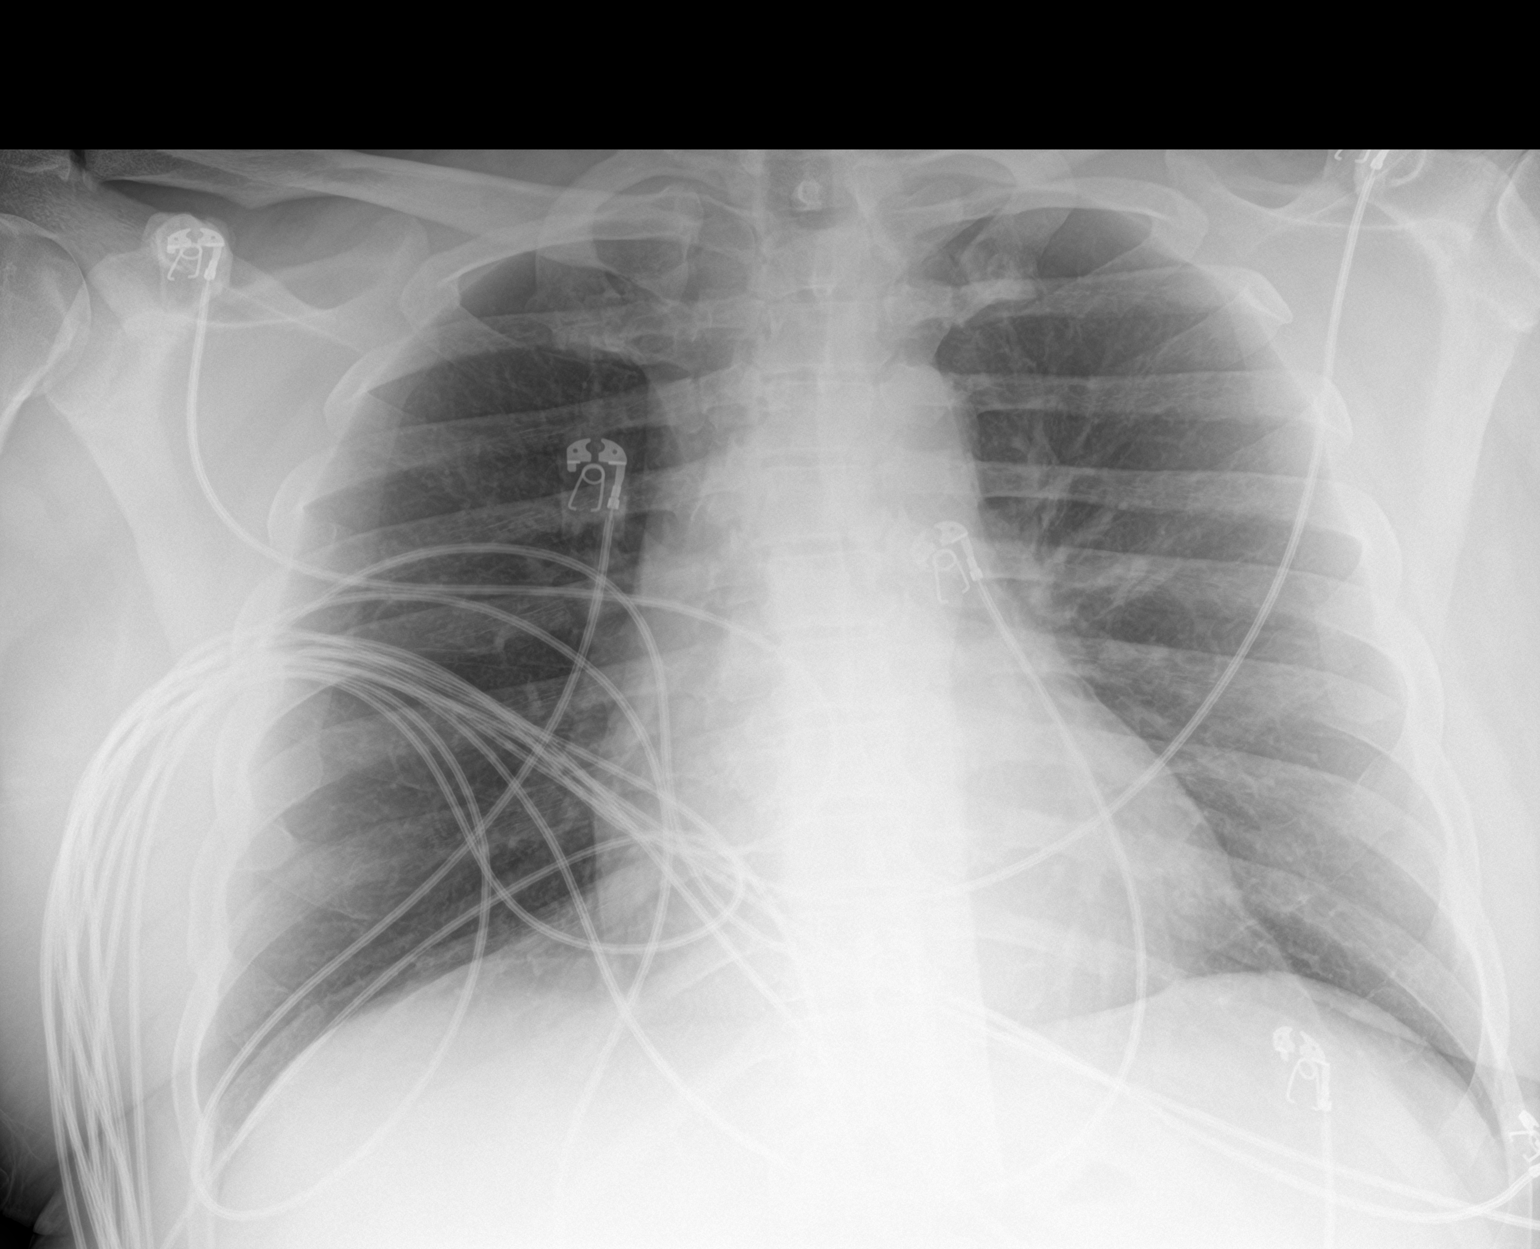

[2 of 2 positions shown; findings below may reference images not displayed]

FINDINGS: The heart size and mediastinal contours are within normal limits.
Both lungs are clear. The visualized skeletal structures are
unremarkable.
IMPRESSION: No active cardiopulmonary disease.

## 2022-04-21 IMAGING — CT CT HEAD CODE STROKE
4 series · 16 of 47 positions shown, 18 images · non-contrast
Comparison: Brain MRI 02/26/2005.  Head CT 02/11/2005.

CLINICAL DATA: Code stroke. 48-year-old male with right side
numbness and difficulty speaking last seen normal 5400 hours.

EXAM:
CT HEAD WITHOUT CONTRAST
TECHNIQUE: Contiguous axial images were obtained from the base of the skull
through the vertex without intravenous contrast.

[Series 3: head 5.0 st · axial · 0.43mm/px · z∈[-149,-14]mm · 7 of 37 slices shown, 9 images]
[im 5/37  brain]
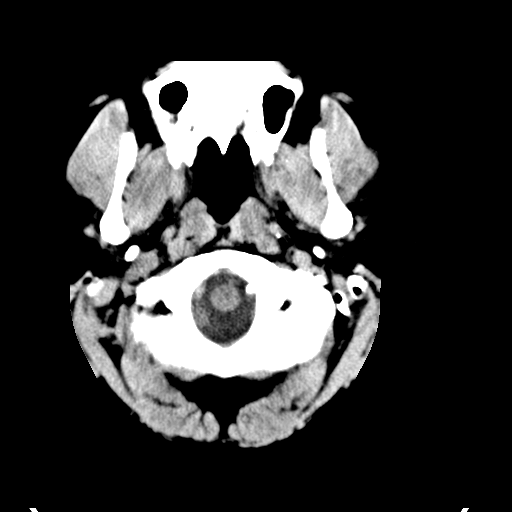
[im 5/37  bone]
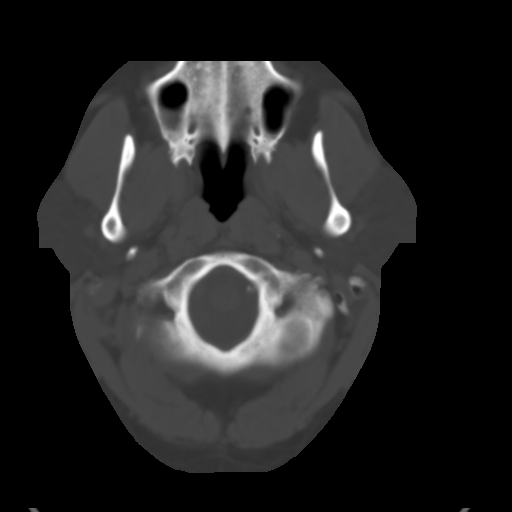
[im 10/37  brain]
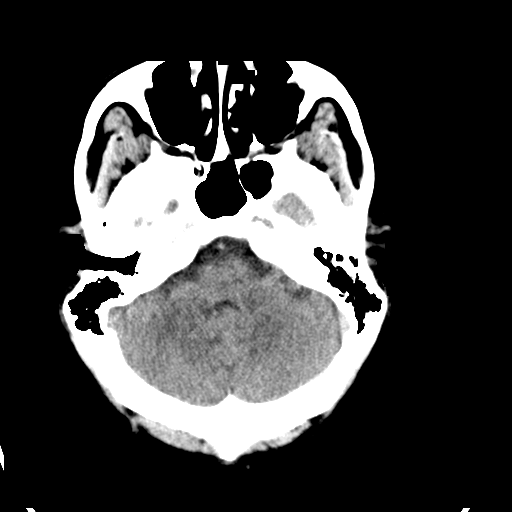
[im 14/37  brain]
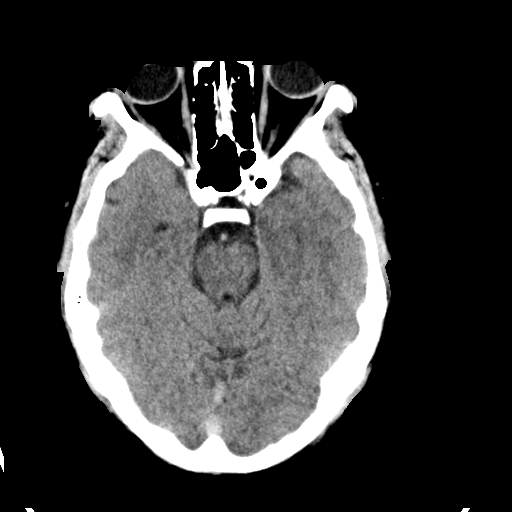
[im 19/37  brain]
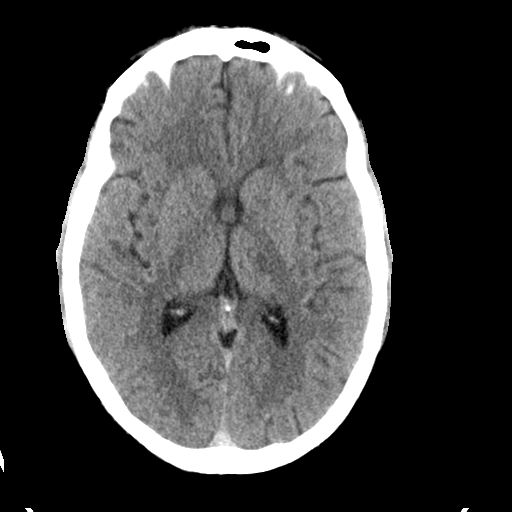
[im 23/37  brain]
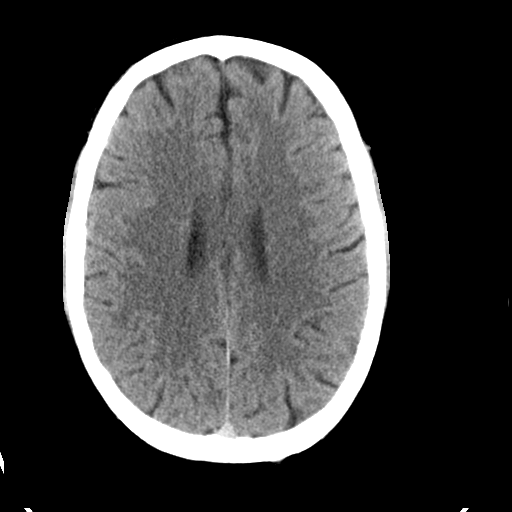
[im 23/37  bone]
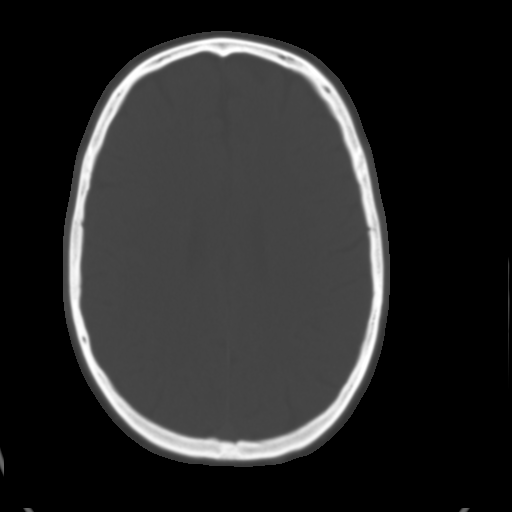
[im 28/37  brain]
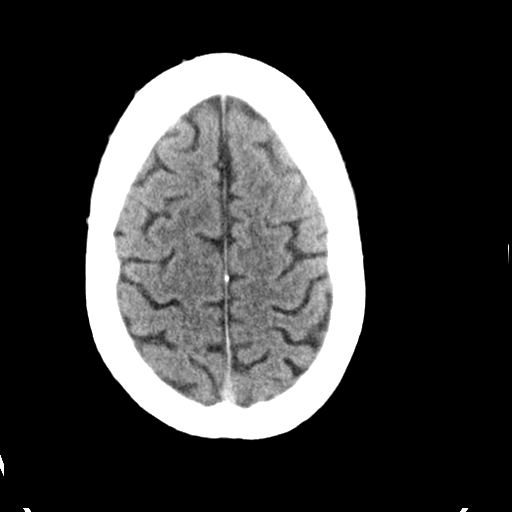
[im 32/37  brain]
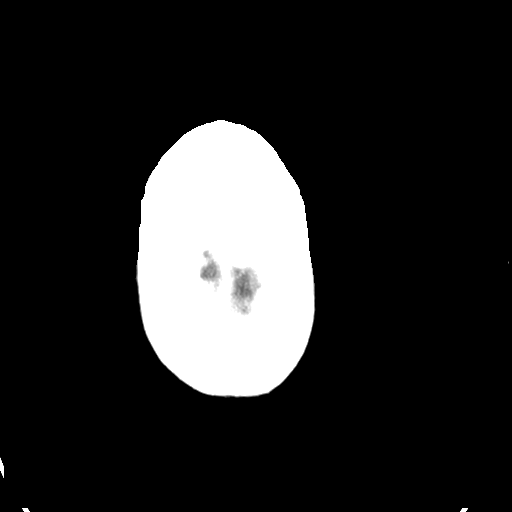

[Series 4: head 2.0 bone · axial · 0.43mm/px · z∈[-151,-115]mm · 3 of 93 slices shown]
[im 10/93  bone]
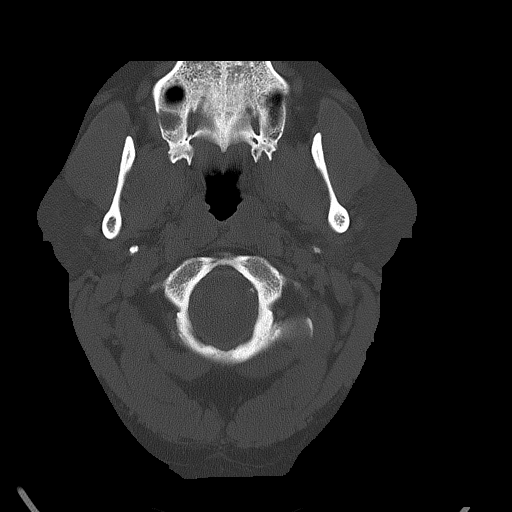
[im 19/93  bone]
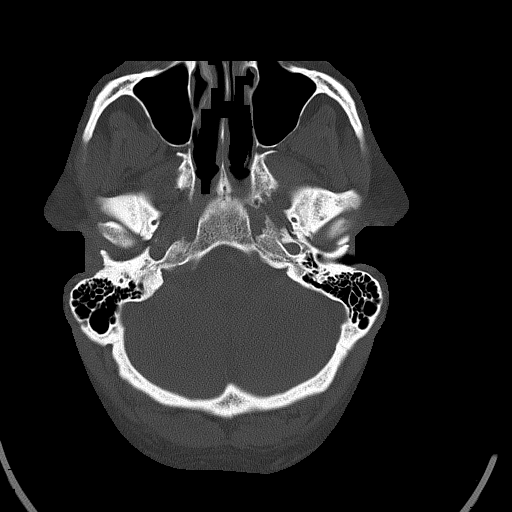
[im 28/93  bone]
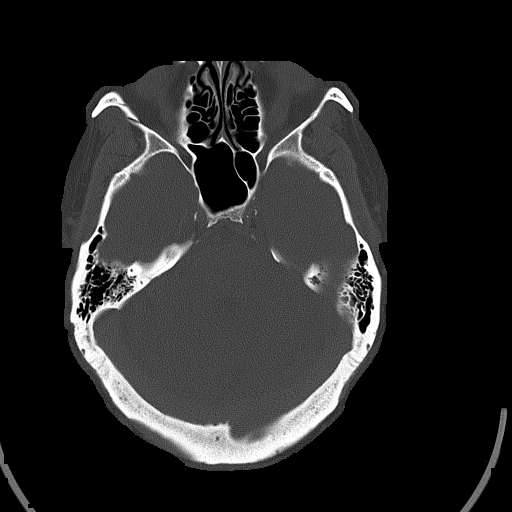

[Series 5: head 3.0 cor st · coronal · 0.36mm/px · 3 of 74 slices shown]
[im 25/74  brain]
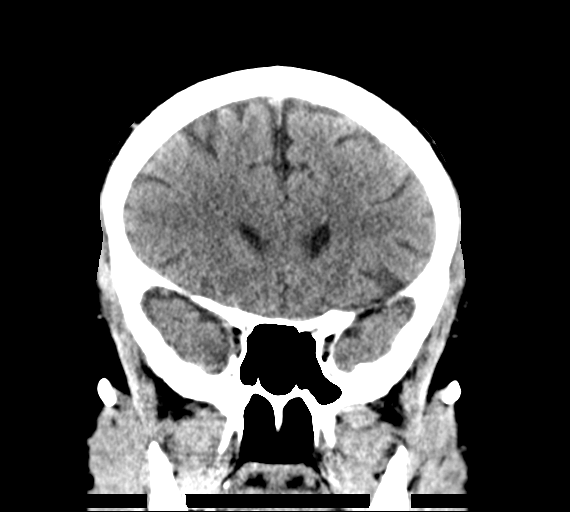
[im 33/74  brain]
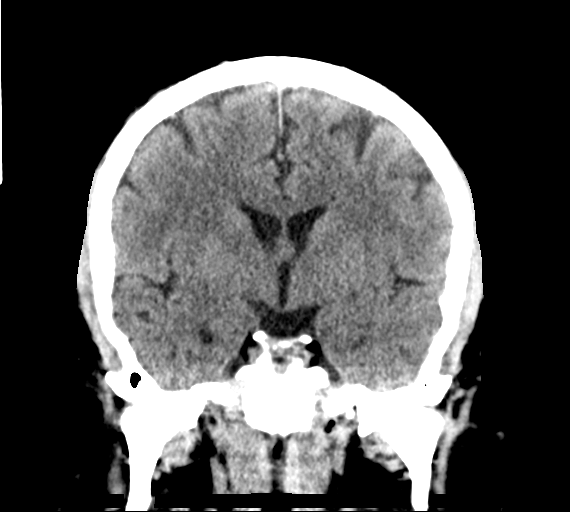
[im 41/74  brain]
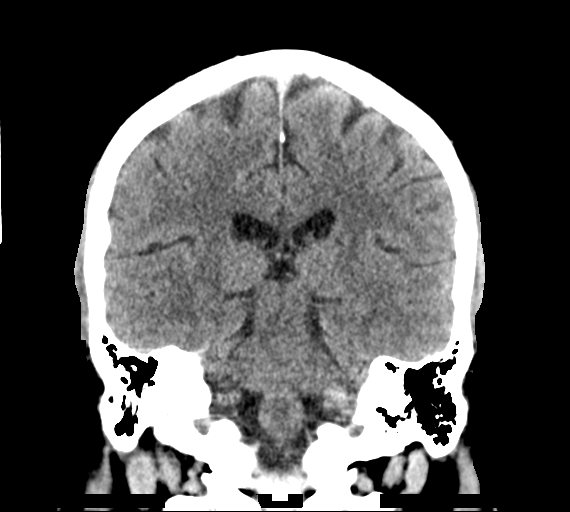

[Series 6: head 3.0 sag st · sagittal · 0.37mm/px · 3 of 61 slices shown]
[im 21/61  brain]
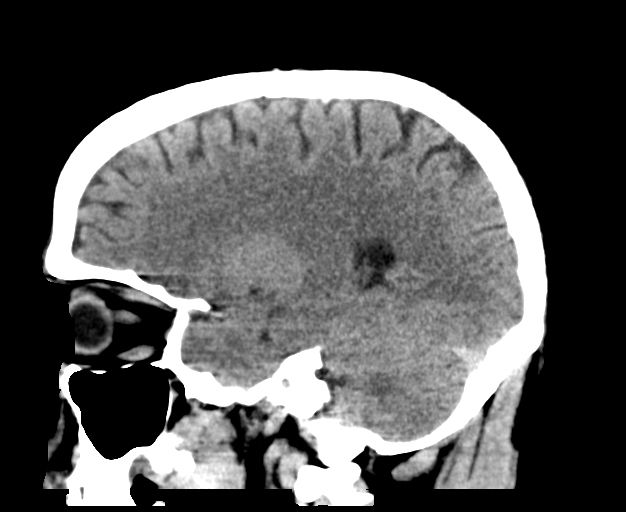
[im 31/61  brain]
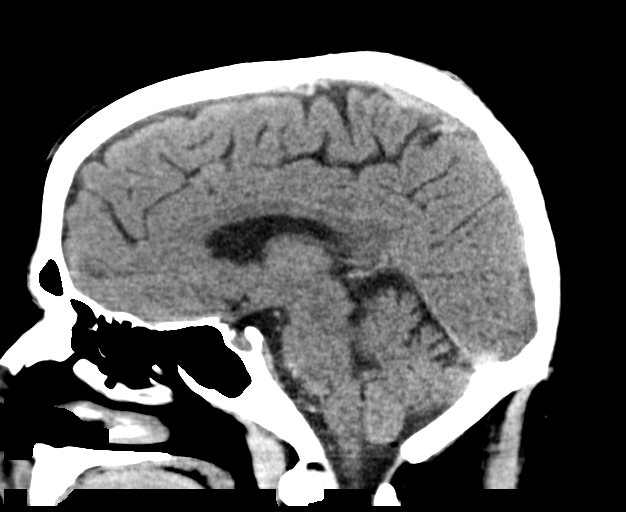
[im 41/61  brain]
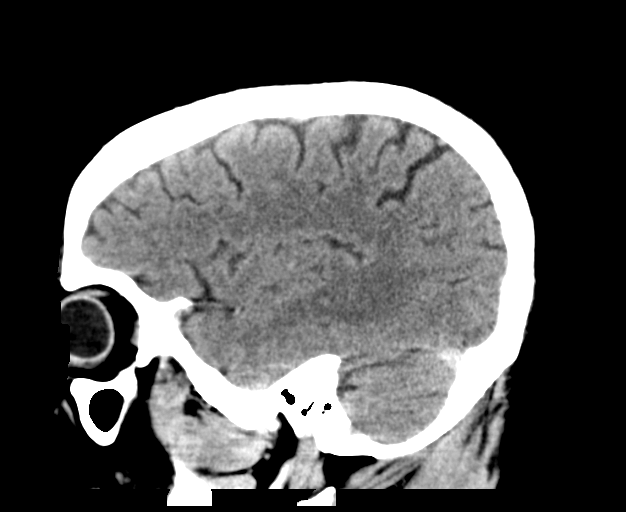

[16 of 47 positions shown; findings below may reference images not displayed]

FINDINGS: Brain: Cerebral volume appears normal. No midline shift,
ventriculomegaly, mass effect, evidence of mass lesion, intracranial
hemorrhage or evidence of cortically based acute infarction.

Subtle new hypodensity in the left lateral thalamus near the
posterior limb of the left internal capsule. Otherwise gray-white
matter differentiation is within normal limits throughout the brain.

Vascular: Calcified atherosclerosis at the skull base. No suspicious
intracranial vascular hyperdensity.

Skull: Chronic appearing periapical lucency at the posterior right
maxilla, likely dental related. No acute osseous abnormality
identified.

Sinuses/Orbits: Paranasal sinuses, tympanic cavities and mastoids
are clear.

Other: Slight rightward gaze. Otherwise negative orbit and scalp
soft tissues.

ASPECTS (Alberta Stroke Program Early CT Score)

Total score (0-10 with 10 being normal): 10
IMPRESSION: 1. Acute lacunar infarct suspected at the lateral left thalamus. No
associated hemorrhage or mass effect.
2. Elsewhere stable and negative noncontrast CT appearance of the
brain. ASPECTS 10.
3. These results were communicated to Dr. Aujla at [DATE] on
01/30/2020 by text page via the [employer] messaging system.
